# Patient Record
Sex: Female | Born: 1980 | Race: Black or African American | Hispanic: No | Marital: Married | Smoking: Never smoker
Health system: Southern US, Community
[De-identification: ages and names within clinical notes are randomized; demographics above are authoritative.]

## PROBLEM LIST (undated history)

## (undated) DIAGNOSIS — D649 Anemia, unspecified: Secondary | ICD-10-CM

## (undated) DIAGNOSIS — J439 Emphysema, unspecified: Secondary | ICD-10-CM

## (undated) DIAGNOSIS — I10 Essential (primary) hypertension: Secondary | ICD-10-CM

## (undated) DIAGNOSIS — G43909 Migraine, unspecified, not intractable, without status migrainosus: Secondary | ICD-10-CM

## (undated) DIAGNOSIS — D259 Leiomyoma of uterus, unspecified: Secondary | ICD-10-CM

## (undated) DIAGNOSIS — N390 Urinary tract infection, site not specified: Secondary | ICD-10-CM

## (undated) HISTORY — DX: Urinary tract infection, site not specified: N39.0

## (undated) HISTORY — DX: Emphysema, unspecified: J43.9

---
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
## 1999-09-23 ENCOUNTER — Encounter: Payer: Self-pay | Admitting: Emergency Medicine

## 1999-09-23 ENCOUNTER — Emergency Department (HOSPITAL_COMMUNITY): Admission: EM | Admit: 1999-09-23 | Discharge: 1999-09-23 | Payer: Self-pay | Admitting: Emergency Medicine

## 1999-12-22 ENCOUNTER — Inpatient Hospital Stay (HOSPITAL_COMMUNITY): Admission: AD | Admit: 1999-12-22 | Discharge: 1999-12-22 | Payer: Self-pay | Admitting: *Deleted

## 2000-01-03 ENCOUNTER — Ambulatory Visit (HOSPITAL_COMMUNITY): Admission: RE | Admit: 2000-01-03 | Discharge: 2000-01-03 | Payer: Self-pay | Admitting: *Deleted

## 2000-02-19 ENCOUNTER — Encounter: Payer: Self-pay | Admitting: *Deleted

## 2000-02-20 ENCOUNTER — Encounter: Payer: Self-pay | Admitting: *Deleted

## 2000-02-20 ENCOUNTER — Observation Stay (HOSPITAL_COMMUNITY): Admission: AD | Admit: 2000-02-20 | Discharge: 2000-02-21 | Payer: Self-pay | Admitting: *Deleted

## 2000-04-04 ENCOUNTER — Inpatient Hospital Stay (HOSPITAL_COMMUNITY): Admission: AD | Admit: 2000-04-04 | Discharge: 2000-04-04 | Payer: Self-pay | Admitting: Obstetrics & Gynecology

## 2000-04-10 ENCOUNTER — Inpatient Hospital Stay (HOSPITAL_COMMUNITY): Admission: AD | Admit: 2000-04-10 | Discharge: 2000-04-10 | Payer: Self-pay | Admitting: *Deleted

## 2000-04-11 ENCOUNTER — Inpatient Hospital Stay (HOSPITAL_COMMUNITY): Admission: AD | Admit: 2000-04-11 | Discharge: 2000-04-11 | Payer: Self-pay | Admitting: Obstetrics & Gynecology

## 2000-04-11 ENCOUNTER — Inpatient Hospital Stay (HOSPITAL_COMMUNITY): Admission: AD | Admit: 2000-04-11 | Discharge: 2000-04-11 | Payer: Self-pay | Admitting: *Deleted

## 2000-04-17 ENCOUNTER — Inpatient Hospital Stay (HOSPITAL_COMMUNITY): Admission: AD | Admit: 2000-04-17 | Discharge: 2000-04-17 | Payer: Self-pay | Admitting: Obstetrics & Gynecology

## 2000-04-20 ENCOUNTER — Encounter: Payer: Self-pay | Admitting: *Deleted

## 2000-04-20 ENCOUNTER — Observation Stay (HOSPITAL_COMMUNITY): Admission: AD | Admit: 2000-04-20 | Discharge: 2000-04-21 | Payer: Self-pay | Admitting: *Deleted

## 2000-04-24 ENCOUNTER — Inpatient Hospital Stay (HOSPITAL_COMMUNITY): Admission: AD | Admit: 2000-04-24 | Discharge: 2000-04-24 | Payer: Self-pay | Admitting: Obstetrics & Gynecology

## 2000-04-24 ENCOUNTER — Encounter: Payer: Self-pay | Admitting: Obstetrics & Gynecology

## 2000-05-03 ENCOUNTER — Inpatient Hospital Stay (HOSPITAL_COMMUNITY): Admission: AD | Admit: 2000-05-03 | Discharge: 2000-05-03 | Payer: Self-pay | Admitting: *Deleted

## 2000-05-26 ENCOUNTER — Inpatient Hospital Stay (HOSPITAL_COMMUNITY): Admission: AD | Admit: 2000-05-26 | Discharge: 2000-05-26 | Payer: Self-pay | Admitting: Obstetrics & Gynecology

## 2000-05-27 ENCOUNTER — Inpatient Hospital Stay (HOSPITAL_COMMUNITY): Admission: AD | Admit: 2000-05-27 | Discharge: 2000-05-29 | Payer: Self-pay | Admitting: *Deleted

## 2000-11-04 ENCOUNTER — Emergency Department (HOSPITAL_COMMUNITY): Admission: EM | Admit: 2000-11-04 | Discharge: 2000-11-04 | Payer: Self-pay | Admitting: Emergency Medicine

## 2000-12-12 ENCOUNTER — Emergency Department (HOSPITAL_COMMUNITY): Admission: EM | Admit: 2000-12-12 | Discharge: 2000-12-12 | Payer: Self-pay | Admitting: Emergency Medicine

## 2000-12-12 ENCOUNTER — Encounter: Payer: Self-pay | Admitting: Emergency Medicine

## 2001-01-14 ENCOUNTER — Encounter: Payer: Self-pay | Admitting: Obstetrics

## 2001-01-14 ENCOUNTER — Inpatient Hospital Stay (HOSPITAL_COMMUNITY): Admission: AD | Admit: 2001-01-14 | Discharge: 2001-01-14 | Payer: Self-pay | Admitting: *Deleted

## 2001-01-28 ENCOUNTER — Encounter: Admission: RE | Admit: 2001-01-28 | Discharge: 2001-01-28 | Payer: Self-pay | Admitting: Family Medicine

## 2001-01-29 ENCOUNTER — Inpatient Hospital Stay (HOSPITAL_COMMUNITY): Admission: AD | Admit: 2001-01-29 | Discharge: 2001-01-29 | Payer: Self-pay | Admitting: Obstetrics & Gynecology

## 2001-02-12 ENCOUNTER — Other Ambulatory Visit: Admission: RE | Admit: 2001-02-12 | Discharge: 2001-02-12 | Payer: Self-pay | Admitting: Obstetrics and Gynecology

## 2001-03-16 ENCOUNTER — Inpatient Hospital Stay (HOSPITAL_COMMUNITY): Admission: AD | Admit: 2001-03-16 | Discharge: 2001-03-16 | Payer: Self-pay | Admitting: *Deleted

## 2001-05-14 HISTORY — PX: TUBAL LIGATION: SHX77

## 2001-05-22 ENCOUNTER — Inpatient Hospital Stay (HOSPITAL_COMMUNITY): Admission: AD | Admit: 2001-05-22 | Discharge: 2001-05-25 | Payer: Self-pay | Admitting: *Deleted

## 2001-05-22 ENCOUNTER — Encounter (INDEPENDENT_AMBULATORY_CARE_PROVIDER_SITE_OTHER): Payer: Self-pay | Admitting: Specialist

## 2001-11-19 ENCOUNTER — Emergency Department (HOSPITAL_COMMUNITY): Admission: EM | Admit: 2001-11-19 | Discharge: 2001-11-19 | Payer: Self-pay

## 2002-06-03 ENCOUNTER — Emergency Department (HOSPITAL_COMMUNITY): Admission: EM | Admit: 2002-06-03 | Discharge: 2002-06-03 | Payer: Self-pay | Admitting: Emergency Medicine

## 2003-03-10 ENCOUNTER — Emergency Department (HOSPITAL_COMMUNITY): Admission: EM | Admit: 2003-03-10 | Discharge: 2003-03-10 | Payer: Self-pay | Admitting: *Deleted

## 2003-03-10 ENCOUNTER — Emergency Department (HOSPITAL_COMMUNITY): Admission: EM | Admit: 2003-03-10 | Discharge: 2003-03-10 | Payer: Self-pay | Admitting: Emergency Medicine

## 2003-09-21 ENCOUNTER — Emergency Department (HOSPITAL_COMMUNITY): Admission: EM | Admit: 2003-09-21 | Discharge: 2003-09-21 | Payer: Self-pay | Admitting: Emergency Medicine

## 2003-12-23 ENCOUNTER — Emergency Department (HOSPITAL_COMMUNITY): Admission: EM | Admit: 2003-12-23 | Discharge: 2003-12-23 | Payer: Self-pay | Admitting: Family Medicine

## 2004-01-11 ENCOUNTER — Emergency Department (HOSPITAL_COMMUNITY): Admission: EM | Admit: 2004-01-11 | Discharge: 2004-01-11 | Payer: Self-pay | Admitting: Family Medicine

## 2004-04-09 ENCOUNTER — Emergency Department (HOSPITAL_COMMUNITY): Admission: EM | Admit: 2004-04-09 | Discharge: 2004-04-09 | Payer: Self-pay | Admitting: Family Medicine

## 2004-07-31 ENCOUNTER — Inpatient Hospital Stay (HOSPITAL_COMMUNITY): Admission: AD | Admit: 2004-07-31 | Discharge: 2004-07-31 | Payer: Self-pay | Admitting: *Deleted

## 2005-02-06 ENCOUNTER — Emergency Department (HOSPITAL_COMMUNITY): Admission: EM | Admit: 2005-02-06 | Discharge: 2005-02-06 | Payer: Self-pay | Admitting: Family Medicine

## 2005-03-22 ENCOUNTER — Encounter: Admission: RE | Admit: 2005-03-22 | Discharge: 2005-03-22 | Payer: Self-pay | Admitting: Family Medicine

## 2005-07-25 ENCOUNTER — Emergency Department (HOSPITAL_COMMUNITY): Admission: EM | Admit: 2005-07-25 | Discharge: 2005-07-25 | Payer: Self-pay | Admitting: Emergency Medicine

## 2005-09-03 ENCOUNTER — Inpatient Hospital Stay (HOSPITAL_COMMUNITY): Admission: AD | Admit: 2005-09-03 | Discharge: 2005-09-04 | Payer: Self-pay | Admitting: Obstetrics & Gynecology

## 2005-12-13 ENCOUNTER — Emergency Department (HOSPITAL_COMMUNITY): Admission: EM | Admit: 2005-12-13 | Discharge: 2005-12-13 | Payer: Self-pay | Admitting: Family Medicine

## 2010-04-09 ENCOUNTER — Emergency Department (HOSPITAL_COMMUNITY): Admission: EM | Admit: 2010-04-09 | Discharge: 2010-04-09 | Payer: Self-pay | Admitting: Emergency Medicine

## 2010-09-20 LAB — DIFFERENTIAL
Eosinophils Relative: 2 % (ref 0–5)
Lymphs Abs: 1.6 10*3/uL (ref 0.7–4.0)
Monocytes Absolute: 0.7 10*3/uL (ref 0.1–1.0)
Monocytes Relative: 11 % (ref 3–12)
Neutro Abs: 4 10*3/uL (ref 1.7–7.7)
Neutrophils Relative %: 62 % (ref 43–77)

## 2010-09-20 LAB — COMPREHENSIVE METABOLIC PANEL
ALT: 15 U/L (ref 0–35)
AST: 22 U/L (ref 0–37)
BUN: 6 mg/dL (ref 6–23)
CO2: 31 mEq/L (ref 19–32)
Chloride: 100 mEq/L (ref 96–112)
Creatinine, Ser: 0.76 mg/dL (ref 0.4–1.2)
Potassium: 3.8 mEq/L (ref 3.5–5.1)
Sodium: 140 mEq/L (ref 135–145)

## 2010-09-20 LAB — URINALYSIS, ROUTINE W REFLEX MICROSCOPIC
Hgb urine dipstick: NEGATIVE
pH: 7.5 (ref 5.0–8.0)

## 2010-09-20 LAB — WET PREP, GENITAL
Trich, Wet Prep: NONE SEEN
Yeast Wet Prep HPF POC: NONE SEEN

## 2010-09-20 LAB — CBC
RBC: 3.83 MIL/uL — ABNORMAL LOW (ref 3.87–5.11)
WBC: 6.5 10*3/uL (ref 4.0–10.5)

## 2010-09-20 LAB — LIPASE, BLOOD: Lipase: 23 U/L (ref 11–59)

## 2010-11-23 NOTE — Discharge Summary (Signed)
[facility]  Patient:    ANABELL, SWINT                     MRN: [id number] Adm. Date:  [id number] Disc. Date: [id number] Attending:  Michaelle Copas Dictator:   Pricilla Holm, M.D.                           Discharge Summary  SERVICE:                      [facility] Teaching Service.  CONSULTS:                     None.  PROCEDURES:                   None.  HOSPITAL COURSE:              Ms. Eduard Clos is a 30 year old G2, P1-0-0-1 at 26-3/7 weeks by 19-week ultrasound.  The patient presented with a two-day history of headaches, unilateral headaches specifically on the left side.  The patient also notes having spots before her eyes.  She says that occasionally she would have the spots and then the headache would follow but she was not quite sure of this chronology.  She states she was doing nothing in particular when the spots and the headaches presented themselves.  She did take some Tylenol with some relief.  She denies any abdominal pain.  She also denied any swelling in her hands or her feet.  PAST OB HISTORY:              This is significant for a term normal spontaneous vaginal delivery requiring magnesium due to onset of preeclampsia during delivery.  She also required magnesium postpartum.  PAST MEDICAL HISTORY:         The patient has bronchitis as her only past medical history.  PAST SURGICAL HISTORY:        She has had nasal surgery in the past.  HOSPITAL COURSE:              The patient was admitted to the teaching service to be followed overnight.  We ran several labs as well as did a 24-hour urine on her.  She was given Stadol with significant relief of headaches.  On the morning of February 21, 2000, she was still without headache and/or symptoms and it was deemed appropriate to send her home.  Headaches were most likely secondary to migraine than preeclampsia.  LABS:                         The patients urine was completely  normal with no protein noted.  Complete metabolic panel revealed sodium 137, potassium 4.1, chloride 105, carbon dioxide 24, BUN 7, creatinine 0.5, and glucose of 79.  The patient had an SGOT of 19, SGPT of 11, alkaline phosphatase 47, lactic dehydrogenase 177, uric acid 3.5.  Normal CBC with platelet count of 258.  A 24-hour urine is currently pending.  DISCHARGE CONDITION:          Good.  DISPOSITION:                  The patient will be discharged to home with her mother.  DISCHARGE MEDICATIONS:        The patient will be given a prescription for Tylox one  tablet p.o. q.6h. p.r.n. headaches.  DISCHARGE INSTRUCTIONS:       The patient can resume normal activity and diet. She was told to return to [facility] if she continues to have headaches that the Tylox is not helping, if she notices increased swelling in her hands, feet or face, or if she has abdominal pain; as well as other signs or symptoms that she may in fact have preeclampsia.  FOLLOW-UP:                    The patient will follow up this week with her routine OB appointment as previously scheduled.  The patient voiced agreement and understanding of the plan and had no further questions. DD:  02/21/00 TD:  02/22/00 Job: [id number]

## 2010-11-23 NOTE — Discharge Summary (Signed)
[facility]  Patient:    ADALEI, NOVELL Visit Number: [id number] MRN: [id number]          Service Type: OBS Location:910A 9106 01 Attending Physician:  Dierdre Forth [place] Dictatedby:   Saverio Danker, C.N.M. Admit Date:  05/22/2001 Disc. Date: 05/25/01                             Discharge Summary  ADMISSION DIAGNOSES:          1. Intrauterine pregnancy at term.                               2. Early labor.                             3. Negative group B Streptococcus.                    4. Multiparity.                               5. Desires bilateral tubal ligation.  DISCHARGE DIAGNOSES:          1. Intrauterine pregnancy at term.                               2. Early labor.                               3. Negative group B Streptococcus.                      4. Multiparity.                               5. Desires bilateral tubal ligation.                               6. Breast andbottle feeding.  PROCEDURES THIS ADMISSION:    1. Normal spontaneous vaginal delivery of                                  a viable female infant, names Tania, who                                  weighted 5 pounds, 14ounces and had Apgars                                  of 9 and 9 on May 23, 2001, attended                                  by Wynelle Bourgeois, C.N.M.                               2.  Bilateral tubal ligation for sterilization                                  on May 24, 2001 by Dr. Dierdre Forth.  HOSPITAL COURSE:              Ms. Eduard Clos is a 30 year old, single, black female, Gravid 3, Para 2-0-0-2 at 39-5/7 weeks admitted in early labor who progressed to normal spontaneous vaginal delivery without complications.  She delivered a viable female infant, named Ena Dawley, who weighted 5 pounds and 14 ounces and had Apgars of 9 and 9 attended [employer], C.N.M.  Post partum she desired a bilateral tubal ligation for  sterilization and underwent the same without complication attendedby Dr. Dierdre Forth on May 24, 2001.  Postoperatively, she has done well.  She is ambulating, voiding and eating without difficulty.  Hervital signs are stable and she is afebrile. She is deemed ready for discharge today.  [facility]  DISCHARGE MEDICATIONS:        1. Motrin 600 mg p.o.q.6h. p.r.n. pain.                               2. Tylox 1 to 2 p.o. q.4-6h. p.r.n. pain.                               3. Prenatal vitamins daily.  DISCHARGE LABORATORY DATA:    Hemoglobin is 10.6, WBC count is 10.8.  Her platelets are 211.  DISCHARGE FOLLOW UP:          Will be at [facility] in six weeks                               or p.r.n. Dictated by:   Vance Gather Duplantis, C.N.M. AttendingPhysicianShaune Spittle DD:  05/25/01 TD:  05/25/01 Job: [id number]

## 2010-11-23 NOTE — Op Note (Signed)
[facility]  Patient:    Donna Kelley, Donna Kelley Visit Number: [id number] MRN: [id number]          Service Type: OBS Location: 910A 9106 01 Attending Physician:  Shaune Spittle Dictated by:   Maris Berger. Pennie Rushing, M.D. Proc. Date: 05/24/01 Admit Date:  05/22/2001                             Operative Report  PREOPERATIVE DIAGNOSIS:       Desire for surgical sterilization.  POSTOPERATIVE DIAGNOSIS:      Desire for surgical sterilization.  OPERATION:                    Postpartum tubal sterilization.  SURGEON:                      Vanessa P. Pennie Rushing, M.D.  ANESTHESIA:                   General orotracheal.  ESTIMATED BLOOD LOSS:         Les than 10 cc.  COMPLICATIONS:                None.  FINDINGS:                     The tubes appeared normal for the postpartum state.  DESCRIPTION OF PROCEDURE:     A discussion had been held with the patient concerning the indications for her procedure, being desire for surgical sterilization and no further children.  A discussion was also held with her concerning the risks involved which include but are not limited to anesthesia, bleeding, infection, damage to adjacent organs and subsequent pregnancy resulting from failure of tubal sterilization.  The patient seemed to understand and wished to proceed.  He was brought to the operating room after appropriate identification and placed on the operating table.  After the attainment of adequate general anesthesia with the patient in the supine position, the abdomen was prepped with multiple layers of Betadine and draped as a sterile field.  Subumbilical injection of 10 cc of 0.25% Marcaine was undertaken.  A subumbilical incision was made and the abdomen opened in layers to enter the peritoneal cavity.  The right fallopian tube was then identified, followed to it fimbriated end, then grasped at the isthmic portion and elevated.  A suture of 2-0 chromic was  placed through the mesosalpinx was tied fore and aft on that knuckle of tube and a second ligature placed proximal to that.  The intervening knuckle of tube was excised and the cut ends cauterized.  Hemostasis was noted to be adequate.  The tubal remains were returned to the peritoneal cavity.  A similar procedure was carried out on the left side.  The abdominal peritoneum was closed in a pursestring fashion with 0 Vicryl.  The fascia was closed in a running fashion with 0 Vicryl.  A subcutaneous suture of 0 Vicryl was placed.  Subcuticular sutures of 3-0 Vicryl were sued to close the skin incision and a sterile dressing applied. The patient was awakened from general anesthesia and taken to the recovery room in satisfactory condition, having tolerated the procedure well, with sponge and instrument counts correct. Dictated by:   Maris Berger. Pennie Rushing, M.D. Attending Physician:  Shaune Spittle DD:  05/24/01 TD:  05/25/01 Job: [id number]
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
## 2011-02-21 ENCOUNTER — Emergency Department (HOSPITAL_COMMUNITY): Payer: Self-pay

## 2011-02-21 ENCOUNTER — Emergency Department (HOSPITAL_COMMUNITY)
Admission: EM | Admit: 2011-02-21 | Discharge: 2011-02-21 | Disposition: A | Discharge status: Home / Self Care | Payer: Self-pay | Attending: Emergency Medicine | Admitting: Emergency Medicine

## 2011-02-21 DIAGNOSIS — X500XXA Overexertion from strenuous movement or load, initial encounter: Secondary | ICD-10-CM | POA: Insufficient documentation

## 2011-02-21 DIAGNOSIS — M25579 Pain in unspecified ankle and joints of unspecified foot: Secondary | ICD-10-CM | POA: Insufficient documentation

## 2011-02-21 DIAGNOSIS — Y92009 Unspecified place in unspecified non-institutional (private) residence as the place of occurrence of the external cause: Secondary | ICD-10-CM | POA: Insufficient documentation

## 2011-02-21 DIAGNOSIS — S93409A Sprain of unspecified ligament of unspecified ankle, initial encounter: Secondary | ICD-10-CM | POA: Insufficient documentation

## 2011-05-07 ENCOUNTER — Emergency Department (HOSPITAL_COMMUNITY)
Admission: EM | Admit: 2011-05-07 | Discharge: 2011-05-07 | Disposition: A | Discharge status: Home / Self Care | Payer: Self-pay | Attending: Emergency Medicine | Admitting: Emergency Medicine

## 2011-05-07 DIAGNOSIS — R110 Nausea: Secondary | ICD-10-CM | POA: Insufficient documentation

## 2011-05-07 DIAGNOSIS — H53149 Visual discomfort, unspecified: Secondary | ICD-10-CM | POA: Insufficient documentation

## 2011-05-07 DIAGNOSIS — G43909 Migraine, unspecified, not intractable, without status migrainosus: Secondary | ICD-10-CM | POA: Insufficient documentation

## 2011-05-07 DIAGNOSIS — R05 Cough: Secondary | ICD-10-CM | POA: Insufficient documentation

## 2011-05-07 DIAGNOSIS — R059 Cough, unspecified: Secondary | ICD-10-CM | POA: Insufficient documentation

## 2011-05-26 ENCOUNTER — Emergency Department (HOSPITAL_COMMUNITY): Payer: Self-pay

## 2011-05-26 ENCOUNTER — Emergency Department (HOSPITAL_COMMUNITY)
Admission: EM | Admit: 2011-05-26 | Discharge: 2011-05-26 | Disposition: A | Discharge status: Home / Self Care | Payer: Self-pay | Attending: Emergency Medicine | Admitting: Emergency Medicine

## 2011-05-26 DIAGNOSIS — R109 Unspecified abdominal pain: Secondary | ICD-10-CM | POA: Insufficient documentation

## 2011-05-26 HISTORY — DX: Migraine, unspecified, not intractable, without status migrainosus: G43.909

## 2011-05-26 LAB — URINALYSIS, ROUTINE W REFLEX MICROSCOPIC
Ketones, ur: NEGATIVE mg/dL
Specific Gravity, Urine: 1.007 (ref 1.005–1.030)

## 2011-05-26 LAB — DIFFERENTIAL
Basophils Absolute: 0 10*3/uL (ref 0.0–0.1)
Basophils Relative: 0 % (ref 0–1)
Eosinophils Absolute: 0.2 10*3/uL (ref 0.0–0.7)
Eosinophils Relative: 4 % (ref 0–5)
Lymphs Abs: 1.5 10*3/uL (ref 0.7–4.0)
Monocytes Absolute: 0.4 10*3/uL (ref 0.1–1.0)
Monocytes Relative: 8 % (ref 3–12)

## 2011-05-26 LAB — COMPREHENSIVE METABOLIC PANEL
ALT: 11 U/L (ref 0–35)
AST: 17 U/L (ref 0–37)
Albumin: 3.5 g/dL (ref 3.5–5.2)
Chloride: 103 mEq/L (ref 96–112)
Creatinine, Ser: 0.68 mg/dL (ref 0.50–1.10)
GFR calc Af Amer: >90 mL/min (ref >90)
GFR calc non Af Amer: >90 mL/min (ref >90)
Potassium: 3.7 mEq/L (ref 3.5–5.1)
Total Bilirubin: 0.2 mg/dL — ABNORMAL LOW (ref 0.3–1.2)

## 2011-05-26 LAB — CBC
HCT: 30.4 % — ABNORMAL LOW (ref 36.0–46.0)
Hemoglobin: 9.5 g/dL — ABNORMAL LOW (ref 12.0–15.0)
MCV: 88.6 fL (ref 78.0–100.0)
RDW: 14.1 % (ref 11.5–15.5)
WBC: 5.4 10*3/uL (ref 4.0–10.5)

## 2011-05-26 LAB — URINE MICROSCOPIC-ADD ON

## 2011-05-26 MED ORDER — MORPHINE SULFATE 4 MG/ML IJ SOLN
6.0000 mg | Freq: Once | INTRAMUSCULAR | Status: AC
  Administered 2011-05-26: 6 mg via INTRAVENOUS
  Filled 2011-05-26: qty 2

## 2011-05-26 MED ORDER — HYDROCODONE-ACETAMINOPHEN 5-325 MG PO TABS: 1.0000 | ORAL_TABLET | ORAL | Status: AC | PRN

## 2011-05-26 MED ORDER — OMEPRAZOLE 20 MG PO CPDR: 20.0000 mg | DELAYED_RELEASE_CAPSULE | Freq: Every day | ORAL | Status: DC

## 2011-05-26 NOTE — ED Provider Notes (Signed)
History     CSN: [id number] Arrival date & time: 05/26/2011  3:12 AM   First MD Initiated Contact with Patient 05/26/11 0402      Chief Complaint  Patient presents with  . Abdominal Pain    (Consider location/radiation/quality/duration/timing/severity/associated sxs/prior treatment) HPI Comments: Patient presents with abdominal pain that began tonight.  It is in her upper abdomen.  It does radiate to her back bilaterally.  She denies any nausea or vomiting or diarrhea.  She has no fevers.  No dysuria or hematuria.  She's currently on her menses.  She's also had a tubal ligation and so does not believe that she is pregnant.  She is otherwise had no abdominal surgeries.  She's not had symptoms like this before.  She did not try taking any pain medicine at home.  There is no specific inciting or relieving factors.  Patient is a 30 y.o. female presenting with abdominal pain. The history is provided by the patient. No language interpreter was used.  Abdominal Pain The primary symptoms of the illness include abdominal pain. The primary symptoms of the illness do not include fever, shortness of breath, nausea, vomiting, diarrhea, dysuria or vaginal discharge.  Symptoms associated with the illness do not include chills or back pain.    Past Medical History  Diagnosis Date  . Migraines     Past Surgical History  Procedure Date  . Tubal ligation     No family history on file.  History  Substance Use Topics  . Smoking status: Never Smoker   . Smokeless tobacco: Not on file  . Alcohol Use:      stated has few drinks every sev. mopnths    OB History    Grav Para Term Preterm Abortions TAB SAB Ect Mult Living                  Review of Systems  Constitutional: Negative.  Negative for fever and chills.  HENT: Negative.   Eyes: Negative.  Negative for discharge and redness.  Respiratory: Negative.  Negative for cough and shortness of breath.   Cardiovascular: Negative.  Negative  for chest pain.  Gastrointestinal: Positive for abdominal pain. Negative for nausea, vomiting and diarrhea.  Genitourinary: Negative.  Negative for dysuria and vaginal discharge.  Musculoskeletal: Negative.  Negative for back pain.  Skin: Negative.  Negative for color change and rash.  Neurological: Negative.  Negative for syncope and headaches.  Hematological: Negative.  Negative for adenopathy.  Psychiatric/Behavioral: Negative.  Negative for confusion.  All other systems reviewed and are negative.    Allergies  Shellfish allergy and Erythromycin  Home Medications   Current Outpatient Rx  Name Route Sig Dispense Refill  . BIOTIN PO Oral Take by mouth.      . IBUPROFEN 200 MG PO TABS Oral Take 400 mg by mouth every 6 (six) hours as needed. headache     . THERA M PLUS PO TABS Oral Take 1 tablet by mouth daily.        BP 133/85  Pulse 79  Temp(Src) 98 F (36.7 C) (Oral)  Resp 20  Ht 5\' 5"  (1.651 m)  Wt 290 lb (131.543 kg)  BMI 48.26 kg/m2  SpO2 100%  LMP 05/26/2011  Physical Exam  Constitutional: She is oriented to person, place, and time. She appears well-developed and well-nourished.  Non-toxic appearance. She does not have a sickly appearance.  HENT:  Head: Normocephalic and atraumatic.  Eyes: Conjunctivae, EOM and lids are normal. Pupils  are equal, round, and reactive to light. No scleral icterus.  Neck: Trachea normal and normal range of motion. Neck supple.  Cardiovascular: Normal rate, regular rhythm and normal heart sounds.   Pulmonary/Chest: Effort normal and breath sounds normal.  Abdominal: Soft. Normal appearance. She exhibits no distension. There is tenderness in the right upper quadrant, epigastric area and left upper quadrant. There is no rebound, no guarding and no CVA tenderness.  Musculoskeletal: Normal range of motion.  Neurological: She is alert and oriented to person, place, and time. She has normal strength.  Skin: Skin is warm, dry and intact. No  rash noted.  Psychiatric: She has a normal mood and affect. Her behavior is normal. Judgment and thought content normal.    ED Course  Procedures (including critical care time)  Labs Reviewed  URINALYSIS, ROUTINE W REFLEX MICROSCOPIC - Abnormal; Notable for the following:    Hgb urine dipstick LARGE (*)    Leukocytes, UA TRACE (*)    All other components within normal limits  CBC - Abnormal; Notable for the following:    RBC 3.43 (*)    Hemoglobin 9.5 (*)    HCT 30.4 (*)    All other components within normal limits  COMPREHENSIVE METABOLIC PANEL - Abnormal; Notable for the following:    Glucose, Bld 100 (*)    Total Bilirubin 0.2 (*)    All other components within normal limits  URINE MICROSCOPIC-ADD ON - Abnormal; Notable for the following:    Bacteria, UA MANY (*)    All other components within normal limits  DIFFERENTIAL  LIPASE, BLOOD  POCT PREGNANCY, URINE  POCT PREGNANCY, URINE   US Abdomen Complete  05/26/2011  *RADIOLOGY REPORT*  Clinical Data:  Right upper quadrant and epigastric pain.  COMPLETE ABDOMINAL ULTRASOUND  Comparison:  04/09/2010  Findings:  Gallbladder:  No gallstones, gallbladder wall thickening, or pericholecystic fluid.  Common bile duct:  Normal where seen, measuring 2.4 mm proximally. The distal duct is obscured.  Liver:  Mildly heterogeneous echogenicity.  No focal lesion.  IVC:  The intrahepatic segment is obscured.  Pancreas:  Not visualized secondary to bowel gas artifact.  Spleen:  Normal sonographic appearance.  Right Kidney:  Normal size and echogenicity.  No hydronephrosis. Measures 10.2 cm.  Left Kidney:  Normal size and echogenicity.  No hydronephrosis. Measures 11.6 cm.  Abdominal aorta:  Inadequately visualized due to bowel gas artifact.  IMPRESSION: Suboptimal examination due to bowel gas artifact.  No definite gallstones or sonographic evidence for cholecystitis.  Pancreas not visualized.  Original Report Authenticated By: Waneta Martins,  M.D.     No diagnosis found.    MDM  Patient presented with upper abdominal pain that started just tonight.  At this point in time I do not have a definitive etiology for her symptoms.  She does not have any elevations in her LFTs or lipase.  She has a normal ultrasound does not demonstrate any gallstones to suggest that this is symptomatic cholelithiasis.  Patient's pain is improved here with one dose of morphine.  She has no fevers or elevated white count indicate infection.  She otherwise has normal vital signs.  Patient has no other associated nausea, vomiting or diarrhea to suggest a gastroenteritis or other acute intra-abdominal pathology.  Patient may have gastritis or peptic ulcer disease and I counseled her on this.  She understands to return for worsening pain, fevers, nausea vomiting or other concerns.        Nat Christen,  MD 05/26/11 [phone]

## 2011-05-26 NOTE — ED Notes (Signed)
Awoke with abd pain along mid section that radiates to back.

## 2011-05-26 NOTE — ED Notes (Signed)
Patient transported to Ultrasound 

## 2012-01-27 ENCOUNTER — Emergency Department (HOSPITAL_COMMUNITY)
Admission: EM | Admit: 2012-01-27 | Discharge: 2012-01-27 | Disposition: A | Discharge status: Home / Self Care | Payer: Self-pay | Attending: Emergency Medicine | Admitting: Emergency Medicine

## 2012-01-27 ENCOUNTER — Emergency Department (HOSPITAL_COMMUNITY): Payer: Self-pay

## 2012-01-27 ENCOUNTER — Encounter (HOSPITAL_COMMUNITY): Payer: Self-pay | Admitting: *Deleted

## 2012-01-27 DIAGNOSIS — M549 Dorsalgia, unspecified: Secondary | ICD-10-CM | POA: Insufficient documentation

## 2012-01-27 MED ORDER — CYCLOBENZAPRINE HCL 10 MG PO TABS: 10.0000 mg | ORAL_TABLET | Freq: Two times a day (BID) | ORAL | Status: AC | PRN

## 2012-01-27 MED ORDER — DICLOFENAC SODIUM 75 MG PO TBEC: 75.0000 mg | DELAYED_RELEASE_TABLET | Freq: Two times a day (BID) | ORAL | Status: AC

## 2012-01-27 MED ORDER — OXYCODONE-ACETAMINOPHEN 5-325 MG PO TABS
1.0000 | ORAL_TABLET | Freq: Once | ORAL | Status: AC
  Administered 2012-01-27: 1 via ORAL
  Filled 2012-01-27: qty 1

## 2012-01-27 MED ORDER — KETOROLAC TROMETHAMINE 60 MG/2ML IM SOLN
60.0000 mg | Freq: Once | INTRAMUSCULAR | Status: AC
  Administered 2012-01-27: 60 mg via INTRAMUSCULAR
  Filled 2012-01-27: qty 2

## 2012-01-27 NOTE — ED Provider Notes (Signed)
Medical screening examination/treatment/procedure(s) were performed by non-physician practitioner and as supervising physician I was immediately available for consultation/collaboration. Devoria Albe, MD, Armando Gang   Ward Givens, MD 01/27/12 1059

## 2012-01-27 NOTE — ED Notes (Signed)
Report received from Night RN. First contact with pt, pt denied any needs, rate pain 3/10 post medication. Asking when to be discharge. Will continue to monitor.

## 2012-01-27 NOTE — ED Notes (Signed)
Pt c/o upper back pain since past Friday afternoon. Pt denies injury. Gradually worsening. Pt states pain is worsened based on position and inspiration.

## 2012-01-27 NOTE — ED Provider Notes (Signed)
Medical screening examination/treatment/procedure(s) were performed by non-physician practitioner and as supervising physician I was immediately available for consultation/collaboration.  Godric Lavell, MD 01/27/12 0827 

## 2012-01-27 NOTE — ED Provider Notes (Signed)
7:33 AM Signed out to me by Jaci Carrel PA-C. Pending Xray result. Xray is normal and Pt's pain improved. Will D/c with NSAIDs and muscle relaxants. Pt agrees with plan and is ready for d/c  Thomasene Lot, PA-C 01/27/12 1324

## 2012-01-27 NOTE — ED Notes (Signed)
Patient moved to room 18 Alinda Money , RN to give report to oncoming dayshift nurse.

## 2012-01-27 NOTE — ED Provider Notes (Signed)
History     CSN: [id number]  Arrival date & time 01/27/12  0446   First MD Initiated Contact with Patient 01/27/12 [phone]      Chief Complaint  Patient presents with  . Back Pain    (Consider location/radiation/quality/duration/timing/severity/associated sxs/prior treatment) HPI Comments: Pt is a 31yo female with no PMHx that presents to the ER w cc of upper mid back pain. Onset began Friday and is described as a dull pain that has been gradually worsening. She has pain with certain movements adn on deep inspiration. Severity is 6/10. Pain does not radiate. She denies fevers, night sweats, chills, cough, hemoptysis, claudication, numbness or tingling of extremities, weakness, HA, or difficulty ambulating. 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 Denies trauma, IV drug use, CA hx, or steroid use. No other complaints at this time.    Patient is a 31 y.o. female presenting with back pain. The history is provided by the patient.  Back Pain     Past Medical History  Diagnosis Date  . Migraines     Past Surgical History  Procedure Date  . Tubal ligation     History reviewed. No pertinent family history.  History  Substance Use Topics  . Smoking status: Never Smoker   . Smokeless tobacco: Not on file  . Alcohol Use:      stated has few drinks every sev. mopnths    OB History    Grav Para Term Preterm Abortions TAB SAB Ect Mult Living                  Review of Systems  Musculoskeletal: Positive for back pain.  All other systems reviewed and are negative.    Allergies  Shellfish allergy and Erythromycin  Home Medications   Current Outpatient Rx  Name Route Sig Dispense Refill  . BIOTIN PO Oral Take 1 tablet by mouth daily.     . IBUPROFEN 200 MG PO TABS Oral Take 400 mg by mouth every 6 (six) hours as needed. headache     . THERA M PLUS PO TABS Oral Take 1 tablet by mouth daily.        BP 126/81  Pulse 77  Temp 98.9 F (37.2 C) (Oral)  Resp 20  SpO2 99%  LMP  01/05/2012  Physical Exam  Nursing note and vitals reviewed. Constitutional: She is oriented to person, place, and time. She appears well-developed and well-nourished. No distress.  HENT:  Head: Normocephalic and atraumatic.  Eyes: Conjunctivae and EOM are normal. Pupils are equal, round, and reactive to light. No scleral icterus.  Neck: Normal range of motion and full passive range of motion without pain. Neck supple. No spinous process tenderness and no muscular tenderness present. No rigidity. Normal range of motion present. No Brudzinski's sign noted.  Cardiovascular: Normal rate, regular rhythm and intact distal pulses.  Exam reveals no gallop and no friction rub.   No murmur heard. Pulmonary/Chest: Effort normal and breath sounds normal. No respiratory distress. She has no wheezes. She has no rales. She exhibits no tenderness.  Musculoskeletal:       Lumbar back: She exhibits no spasm and normal pulse.       Back:       Right foot: She exhibits no swelling.       Left foot: She exhibits no swelling.       Bilateral lower extremities nontender without color change, baseline range of motion of extremities with intact distal pulses, capillary refill less than  2 seconds bilaterally.  Pt has increased pain w ROM of lumbar spine. Pain w ambulation, no sign of ataxia.  Neurological: She is alert and oriented to person, place, and time. She has normal strength and normal reflexes. No sensory deficit. Gait (no ataxia, slowed and hunched d/t pain ) abnormal.       Sensation at baseline for light touch in all 4 distal extremities, motor symmetric & bilateral 5/5 (hips: abduction, adduction, flexion; knee: flexion & extension; foot: dorsiflexion, plantar flexion, toes: dorsi flexion) Patellar & ankle reflexes intact.   Skin: Skin is warm and dry. No rash noted. She is not diaphoretic. No erythema. No pallor.  Psychiatric: She has a normal mood and affect.    ED Course  Procedures (including  critical care time)  Labs Reviewed - No data to display No results found.   No diagnosis found.    MDM  Back pain  Pt is seen and examined; Initial history and physical done, pain meds given. imaging pending. Disposition will be pending lab studies and reassessment. Will be signed out to the oncoming provider, Albertine Grates           [place], [place] 01/27/12 [phone]

## 2012-04-06 ENCOUNTER — Encounter (HOSPITAL_COMMUNITY): Payer: Self-pay | Admitting: *Deleted

## 2012-04-06 ENCOUNTER — Emergency Department (HOSPITAL_COMMUNITY)
Admission: EM | Admit: 2012-04-06 | Discharge: 2012-04-06 | Disposition: A | Discharge status: Home / Self Care | Payer: Self-pay | Attending: Emergency Medicine | Admitting: Emergency Medicine

## 2012-04-06 DIAGNOSIS — Z881 Allergy status to other antibiotic agents status: Secondary | ICD-10-CM | POA: Insufficient documentation

## 2012-04-06 DIAGNOSIS — Z91013 Allergy to seafood: Secondary | ICD-10-CM | POA: Insufficient documentation

## 2012-04-06 DIAGNOSIS — R51 Headache: Secondary | ICD-10-CM | POA: Insufficient documentation

## 2012-04-06 DIAGNOSIS — R002 Palpitations: Secondary | ICD-10-CM | POA: Insufficient documentation

## 2012-04-06 LAB — BASIC METABOLIC PANEL
BUN: 9 mg/dL (ref 6–23)
Calcium: 8.9 mg/dL (ref 8.4–10.5)
Creatinine, Ser: 0.7 mg/dL (ref 0.50–1.10)
GFR calc Af Amer: >90 mL/min (ref >90)
GFR calc non Af Amer: >90 mL/min (ref >90)
Glucose, Bld: 98 mg/dL (ref 70–99)

## 2012-04-06 MED ORDER — OXYCODONE-ACETAMINOPHEN 5-325 MG PO TABS
1.0000 | ORAL_TABLET | Freq: Once | ORAL | Status: AC
  Administered 2012-04-06: 1 via ORAL
  Filled 2012-04-06: qty 1

## 2012-04-06 MED ORDER — ACETAMINOPHEN 325 MG PO TABS
650.0000 mg | ORAL_TABLET | Freq: Once | ORAL | Status: AC
  Administered 2012-04-06: 650 mg via ORAL
  Filled 2012-04-06: qty 2

## 2012-04-06 MED ORDER — IBUPROFEN 800 MG PO TABS
800.0000 mg | ORAL_TABLET | Freq: Once | ORAL | Status: AC
  Administered 2012-04-06: 800 mg via ORAL
  Filled 2012-04-06: qty 1

## 2012-04-06 NOTE — ED Provider Notes (Signed)
History     CSN: [id number]  Arrival date & time 04/06/12  7846   First MD Initiated Contact with Patient 04/06/12 [phone]      Chief Complaint  Patient presents with  . Palpitations  . Headache    (Consider location/radiation/quality/duration/timing/severity/associated sxs/prior treatment) Patient is a 31 y.o. female presenting with palpitations and headaches. The history is provided by the patient.  Palpitations  This is a new problem. Associated symptoms include headaches. Pertinent negatives include no numbness, no chest pain, no abdominal pain, no nausea, no vomiting, no back pain, no weakness and no shortness of breath.  Headache  Associated symptoms include palpitations. Pertinent negatives include no shortness of breath, no nausea and no vomiting.  patient presents with palpitations. She states they come and go. She's been having them for a few months. Slightly worse today. No lightheadedness or dizziness. She has had one episode of chest pain was not associated with the palpitations. The lightheadedness dizziness. No cough. No fevers. No abdominal pain.  Patient also been complaining of a headache. It is dull and is on left side of her head. It is worse with palpation. No photophobia. No trauma. Mild nausea without vomiting. No trouble hearing. No trouble seeing. No double vision. This is different than her typical migraine.  Past Medical History  Diagnosis Date  . Migraines     Past Surgical History  Procedure Date  . Tubal ligation     History reviewed. No pertinent family history.  History  Substance Use Topics  . Smoking status: Never Smoker   . Smokeless tobacco: Not on file  . Alcohol Use:      stated has few drinks every sev. mopnths    OB History    Grav Para Term Preterm Abortions TAB SAB Ect Mult Living                  Review of Systems  Constitutional: Negative for activity change and appetite change.  HENT: Negative for neck stiffness.   Eyes:  Negative for pain.  Respiratory: Negative for chest tightness and shortness of breath.   Cardiovascular: Positive for palpitations. Negative for chest pain and leg swelling.  Gastrointestinal: Negative for nausea, vomiting, abdominal pain and diarrhea.  Genitourinary: Negative for flank pain.  Musculoskeletal: Negative for back pain.  Skin: Negative for rash.  Neurological: Positive for headaches. Negative for weakness and numbness.  Psychiatric/Behavioral: Negative for behavioral problems.    Allergies  Shellfish allergy and Erythromycin  Home Medications   Current Outpatient Rx  Name Route Sig Dispense Refill  . BIOTIN PO Oral Take 1 tablet by mouth daily.     Marland Kitchen DICLOFENAC SODIUM 75 MG PO TBEC Oral Take 1 tablet (75 mg total) by mouth 2 (two) times daily. 30 tablet 0  . IBUPROFEN 200 MG PO TABS Oral Take 400 mg by mouth every 6 (six) hours as needed. headache     . THERA M PLUS PO TABS Oral Take 1 tablet by mouth daily.        BP 159/72  Pulse 52  Temp 98 F (36.7 C) (Oral)  Resp 21  SpO2 98%  LMP 04/01/2012  Physical Exam  Nursing note and vitals reviewed. Constitutional: She is oriented to person, place, and time. She appears well-developed and well-nourished.  HENT:  Head: Normocephalic and atraumatic.       No sinus tenderness. TMs normal bilaterally. Extraocular movements intact. No rash. Mild tenderness over left temporal area. No mass.  Eyes:  EOM are normal. Pupils are equal, round, and reactive to light.  Neck: Normal range of motion. Neck supple.  Cardiovascular: Normal rate, regular rhythm and normal heart sounds.   No murmur heard. Pulmonary/Chest: Effort normal and breath sounds normal. No respiratory distress. She has no wheezes. She has no rales.  Abdominal: Soft. Bowel sounds are normal. She exhibits no distension. There is no tenderness. There is no rebound and no guarding.  Musculoskeletal: Normal range of motion.  Neurological: She is alert and  oriented to person, place, and time. No cranial nerve deficit.  Skin: Skin is warm and dry.  Psychiatric: She has a normal mood and affect. Her speech is normal.    ED Course  Procedures (including critical care time)   Labs Reviewed  BASIC METABOLIC PANEL   No results found.   1. Palpitations   2. Headache     Date: 04/06/2012  Rate: *63  Rhythm: normal sinus rhythm  QRS Axis: normal  Intervals: normal  ST/T Wave abnormalities: normal  Conduction Disutrbances:none  Narrative Interpretation:   Old EKG Reviewed: none available    MDM  Patient with headache and palpitations. EKG is reassuring. Benign exam and history. Will discharge home         Juliet Rude. Rubin Payor, MD 04/06/12 [phone]

## 2012-04-06 NOTE — ED Notes (Signed)
Patient is alert and oriented x3.  She is complaining of headache that she rates a 5 of 10. She has an additional complaint of heart palpitations with lightheaded and dizziness. She does report some nausea but denies vomiting.

## 2012-04-06 NOTE — ED Notes (Signed)
MD at bedside. 

## 2013-08-01 ENCOUNTER — Encounter (HOSPITAL_COMMUNITY): Payer: Self-pay | Admitting: Emergency Medicine

## 2013-08-01 ENCOUNTER — Emergency Department (HOSPITAL_COMMUNITY): Payer: Self-pay

## 2013-08-01 ENCOUNTER — Emergency Department (HOSPITAL_COMMUNITY)
Admission: EM | Admit: 2013-08-01 | Discharge: 2013-08-01 | Disposition: A | Discharge status: Home / Self Care | Payer: Self-pay | Attending: Emergency Medicine | Admitting: Emergency Medicine

## 2013-08-01 DIAGNOSIS — R5381 Other malaise: Secondary | ICD-10-CM | POA: Insufficient documentation

## 2013-08-01 DIAGNOSIS — R5383 Other fatigue: Secondary | ICD-10-CM

## 2013-08-01 DIAGNOSIS — R52 Pain, unspecified: Secondary | ICD-10-CM | POA: Insufficient documentation

## 2013-08-01 DIAGNOSIS — Z8669 Personal history of other diseases of the nervous system and sense organs: Secondary | ICD-10-CM | POA: Insufficient documentation

## 2013-08-01 DIAGNOSIS — J3489 Other specified disorders of nose and nasal sinuses: Secondary | ICD-10-CM | POA: Insufficient documentation

## 2013-08-01 DIAGNOSIS — J40 Bronchitis, not specified as acute or chronic: Secondary | ICD-10-CM | POA: Insufficient documentation

## 2013-08-01 MED ORDER — ACETAMINOPHEN 325 MG PO TABS
650.0000 mg | ORAL_TABLET | Freq: Once | ORAL | Status: AC
  Administered 2013-08-01: 650 mg via ORAL

## 2013-08-01 MED ORDER — GUAIFENESIN ER 600 MG PO TB12: 1200.0000 mg | ORAL_TABLET | Freq: Two times a day (BID) | ORAL | Status: DC

## 2013-08-01 MED ORDER — CETIRIZINE-PSEUDOEPHEDRINE ER 5-120 MG PO TB12: 1.0000 | ORAL_TABLET | Freq: Two times a day (BID) | ORAL | Status: DC

## 2013-08-01 MED ORDER — ALBUTEROL SULFATE HFA 108 (90 BASE) MCG/ACT IN AERS
2.0000 | INHALATION_SPRAY | RESPIRATORY_TRACT | Status: DC | PRN
  Administered 2013-08-01: 2 via RESPIRATORY_TRACT
  Filled 2013-08-01: qty 6.7

## 2013-08-01 NOTE — Discharge Instructions (Signed)
Your chest x-ray today did not show any signs of pneumonia or other concerning cause of your cough and congestion symptoms. Please use the albuterol inhaler that you were given by taking 2 puffs every 4 hours as needed for cough and chest tightness. Use medications recommended to help with your symptoms. Followup with a primary care provider for continued evaluation and treatment.    Bronchitis Bronchitis is inflammation of the airways that extend from the windpipe into the lungs (bronchi). The inflammation often causes mucus to develop, which leads to a cough. If the inflammation becomes severe, it may cause shortness of breath. CAUSES  Bronchitis may be caused by:   Viral infections.   Bacteria.   Cigarette smoke.   Allergens, pollutants, and other irritants.  SIGNS AND SYMPTOMS  The most common symptom of bronchitis is a frequent cough that produces mucus. Other symptoms include:  Fever.   Body aches.   Chest congestion.   Chills.   Shortness of breath.   Sore throat.  DIAGNOSIS  Bronchitis is usually diagnosed through a medical history and physical exam. Tests, such as chest X-rays, are sometimes done to rule out other conditions.  TREATMENT  You may need to avoid contact with whatever caused the problem (smoking, for example). Medicines are sometimes needed. These may include:  Antibiotics. These may be prescribed if the condition is caused by bacteria.  Cough suppressants. These may be prescribed for relief of cough symptoms.   Inhaled medicines. These may be prescribed to help open your airways and make it easier for you to breathe.   Steroid medicines. These may be prescribed for those with recurrent (chronic) bronchitis. HOME CARE INSTRUCTIONS  Get plenty of rest.   Drink enough fluids to keep your urine clear or pale yellow (unless you have a medical condition that requires fluid restriction). Increasing fluids may help thin your secretions and  will prevent dehydration.   Only take over-the-counter or prescription medicines as directed by your health care provider.  Only take antibiotics as directed. Make sure you finish them even if you start to feel better.  Avoid secondhand smoke, irritating chemicals, and strong fumes. These will make bronchitis worse. If you are a smoker, quit smoking. Consider using nicotine gum or skin patches to help control withdrawal symptoms. Quitting smoking will help your lungs heal faster.   Put a cool-mist humidifier in your bedroom at night to moisten the air. This may help loosen mucus. Change the water in the humidifier daily. You can also run the hot water in your shower and sit in the bathroom with the door closed for 5 10 minutes.   Follow up with your health care provider as directed.   Wash your hands frequently to avoid catching bronchitis again or spreading an infection to others.  SEEK MEDICAL CARE IF: Your symptoms do not improve after 1 week of treatment.  SEEK IMMEDIATE MEDICAL CARE IF:  Your fever increases.  You have chills.   You have chest pain.   You have worsening shortness of breath.   You have bloody sputum.  You faint.  You have lightheadedness.  You have a severe headache.   You vomit repeatedly. MAKE SURE YOU:   Understand these instructions.  Will watch your condition.  Will get help right away if you are not doing well or get worse. Document Released: 06/24/2005 Document Revised: 04/14/2013 Document Reviewed: 02/16/2013 [facility] Patient Information 2014 [place].

## 2013-08-01 NOTE — ED Notes (Addendum)
Presents with nasal congestion, sore throat, runny nose and cough began last night. +producitve cough with green sputum. Reports chills, generalized body aches and headaches.

## 2013-08-01 NOTE — ED Provider Notes (Signed)
CSN: [id number]     Arrival date & time 08/01/13  0016 History   First MD Initiated Contact with Patient 08/01/13 [phone]     Chief Complaint  Patient presents with  . URI   HPI  History provided by the patient. Patient is a 33 year old female with history of migraines who presents with symptoms of cough, nasal congestion, sore throat and bodyaches. Symptoms first began last night with scratchy sore throat followed by nasal congestion, rhinorrhea and bodyaches. Patient has also felt feverish with hot and cold flashes. Symptoms also sedated with a mild headache. She denies any recent travel. She has been around other people have been sick recently. She denies any associated nausea, vomiting or diarrhea. She is not using medications for treatment. No other aggravating or alleviating factors. No other associated symptoms.    Past Medical History  Diagnosis Date  . Migraines    Past Surgical History  Procedure Laterality Date  . Tubal ligation     History reviewed. No pertinent family history. History  Substance Use Topics  . Smoking status: Never Smoker   . Smokeless tobacco: Not on file  . Alcohol Use:      Comment: stated has few drinks every sev. mopnths   OB History   Grav Para Term Preterm Abortions TAB SAB Ect Mult Living                 Review of Systems  Constitutional: Positive for fever, chills and fatigue.  HENT: Positive for congestion, rhinorrhea and sore throat.   Respiratory: Positive for cough.   Cardiovascular: Negative for chest pain.  Gastrointestinal: Negative for nausea, vomiting and diarrhea.  Musculoskeletal: Positive for myalgias.  Skin: Negative for rash.  All other systems reviewed and are negative.    Allergies  Shellfish allergy and Erythromycin  Home Medications   Current Outpatient Rx  Name  Route  Sig  Dispense  Refill  . Tetrahydroz-Polyvinyl Al-Povid (MURINE TEARS PLUS) 0.05-0.5-0.6 % SOLN   Left Eye   Place 1-3 drops into the left eye  as needed (eye irritation and dryness).          BP 160/107  Pulse 86  Temp(Src) 98.8 F (37.1 C) (Oral)  Resp 16  SpO2 98%  LMP 07/26/2013 Physical Exam  Nursing note and vitals reviewed. Constitutional: She is oriented to person, place, and time. She appears well-developed and well-nourished. No distress.  HENT:  Head: Normocephalic.  Right Ear: Tympanic membrane normal.  Left Ear: Tympanic membrane normal.  Mouth/Throat: Oropharynx is clear and moist.  Neck: Normal range of motion. Neck supple.  No meningeal signs  Cardiovascular: Normal rate and regular rhythm.   Pulmonary/Chest: Effort normal and breath sounds normal. No respiratory distress. She has no wheezes. She has no rales.  Abdominal: Soft. There is no tenderness. There is no rebound and no guarding.  Obese  Musculoskeletal: Normal range of motion.  Neurological: She is alert and oriented to person, place, and time.  Skin: Skin is warm and dry. No rash noted.  Psychiatric: She has a normal mood and affect. Her behavior is normal.    ED Course  Procedures   DIAGNOSTIC STUDIES: Oxygen Saturation is 100% on room air.    COORDINATION OF CARE:  Nursing notes reviewed. Vital signs reviewed. Initial pt interview and examination performed.   2:13 AM-patient seen and evaluated. She does not appear in any acute distress. Does not appear severely ill or toxic. Chest x-ray reviewed. No concerning findings for  pneumonia. Normal respirations and O2 sats. Symptoms consistent with viral process. Discussed treatment plan with pt at bedside, which includes medicines for symptoms. Pt agrees with plan.    Imaging Review Dg Chest 2 View  08/01/2013   CLINICAL DATA:  Shortness of breath, history of bronchitis.  EXAM: CHEST  2 VIEW  COMPARISON:  Chest radiograph May 21, 2006  FINDINGS: Cardiomediastinal silhouette is unremarkable. The lungs are clear without pleural effusions or focal consolidations. Pulmonary vasculature is  unremarkable. Trachea projects midline and there is no pneumothorax. Soft tissue planes and included osseous structures are nonsuspicious.  IMPRESSION: No acute cardiopulmonary process.   Electronically Signed   By: Elon Alas   On: 08/01/2013 01:17     MDM   1. Bronchitis       Martie Lee, PA-C 08/01/13 2240

## 2013-08-02 NOTE — ED Provider Notes (Signed)
Medical screening examination/treatment/procedure(s) were performed by non-physician practitioner and as supervising physician I was immediately available for consultation/collaboration.   Delora Fuel, MD 97/02/63 [phone]

## 2013-11-28 ENCOUNTER — Encounter (HOSPITAL_COMMUNITY): Payer: Self-pay | Admitting: Emergency Medicine

## 2013-11-28 DIAGNOSIS — Z791 Long term (current) use of non-steroidal anti-inflammatories (NSAID): Secondary | ICD-10-CM | POA: Insufficient documentation

## 2013-11-28 DIAGNOSIS — X503XXA Overexertion from repetitive movements, initial encounter: Secondary | ICD-10-CM | POA: Insufficient documentation

## 2013-11-28 DIAGNOSIS — Z79899 Other long term (current) drug therapy: Secondary | ICD-10-CM | POA: Insufficient documentation

## 2013-11-28 DIAGNOSIS — Y939 Activity, unspecified: Secondary | ICD-10-CM | POA: Insufficient documentation

## 2013-11-28 DIAGNOSIS — Y929 Unspecified place or not applicable: Secondary | ICD-10-CM | POA: Insufficient documentation

## 2013-11-28 DIAGNOSIS — S43429A Sprain of unspecified rotator cuff capsule, initial encounter: Secondary | ICD-10-CM | POA: Insufficient documentation

## 2013-11-28 DIAGNOSIS — Z7982 Long term (current) use of aspirin: Secondary | ICD-10-CM | POA: Insufficient documentation

## 2013-11-28 DIAGNOSIS — Z8679 Personal history of other diseases of the circulatory system: Secondary | ICD-10-CM | POA: Insufficient documentation

## 2013-11-28 NOTE — ED Notes (Signed)
C/o L shoulder and L clavicle pain x 1 month.  Denies known injury.  Pain worse with movement.

## 2013-11-29 ENCOUNTER — Emergency Department (HOSPITAL_COMMUNITY): Payer: Self-pay

## 2013-11-29 ENCOUNTER — Emergency Department (HOSPITAL_COMMUNITY)
Admission: EM | Admit: 2013-11-29 | Discharge: 2013-11-29 | Disposition: A | Discharge status: Home / Self Care | Payer: Self-pay | Attending: Emergency Medicine | Admitting: Emergency Medicine

## 2013-11-29 DIAGNOSIS — S46019A Strain of muscle(s) and tendon(s) of the rotator cuff of unspecified shoulder, initial encounter: Secondary | ICD-10-CM

## 2013-11-29 DIAGNOSIS — M25512 Pain in left shoulder: Secondary | ICD-10-CM

## 2013-11-29 MED ORDER — OXYCODONE-ACETAMINOPHEN 5-325 MG PO TABS
2.0000 | ORAL_TABLET | Freq: Once | ORAL | Status: AC
  Administered 2013-11-29: 2 via ORAL
  Filled 2013-11-29: qty 2

## 2013-11-29 MED ORDER — TRAMADOL HCL 50 MG PO TABS: 50.0000 mg | ORAL_TABLET | Freq: Four times a day (QID) | ORAL | Status: DC | PRN

## 2013-11-29 MED ORDER — MELOXICAM 7.5 MG PO TABS: 15.0000 mg | ORAL_TABLET | Freq: Every day | ORAL | Status: DC

## 2013-11-29 NOTE — Discharge Instructions (Signed)
Recommend Mobic as prescribed for symptoms as well as ice to your shoulder 3-4 times per day. You may take Tylenol as prescribed for severe pain/breakthrough pain control. Followup with orthopedics for further evaluation of your symptoms. Refrain from strenuous activity or heavy lifting for one week. Return if symptoms worsen.  Rotator Cuff Injury Rotator cuff injury is any type of injury to the set of muscles and tendons that make up the stabilizing unit of your shoulder. This unit holds the ball of your upper arm bone (humerus) in the socket of your shoulder blade (scapula).  CAUSES Injuries to your rotator cuff most commonly come from sports or activities that cause your arm to be moved repeatedly over your head. Examples of this include throwing, weight lifting, swimming, or racquet sports. Long lasting (chronic) irritation of your rotator cuff can cause soreness and swelling (inflammation), bursitis, and eventual damage to your tendons, such as a tear (rupture). SIGNS AND SYMPTOMS Acute rotator cuff tear:  Sudden tearing sensation followed by severe pain shooting from your upper shoulder down your arm toward your elbow.  Decreased range of motion of your shoulder because of pain and muscle spasm.  Severe pain.  Inability to raise your arm out to the side because of pain and loss of muscle power (large tears). Chronic rotator cuff tear:  Pain that usually is worse at night and may interfere with sleep.  Gradual weakness and decreased shoulder motion as the pain worsens.  Decreased range of motion. Rotator cuff tendinitis:  Deep ache in your shoulder and the outside upper arm over your shoulder.  Pain that comes on gradually and becomes worse when lifting your arm to the side or turning it inward. DIAGNOSIS Rotator cuff injury is diagnosed through a medical history, physical exam, and imaging exam. The medical history helps determine the type of rotator cuff injury. Your health care  provider will look at your injured shoulder, feel the injured area, and ask you to move your shoulder in different positions. X-ray exams typically are done to rule out other causes of shoulder pain, such as fractures. MRI is the exam of choice for the most severe shoulder injuries because the images show muscles and tendons.  TREATMENT  Chronic tear:  Medicine for pain, such as acetaminophen or ibuprofen.  Physical therapy and range-of-motion exercises may be helpful in maintaining shoulder function and strength.  Steroid injections into your shoulder joint.  Surgical repair of the rotator cuff if the injury does not heal with noninvasive treatment. Acute tear:  Anti-inflammatory medicines such as ibuprofen and naproxen to help reduce pain and swelling.  A sling to help support your arm and rest your rotator cuff muscles. Long-term use of a sling is not advised. It may cause significant stiffening of the shoulder joint.  Surgery may be considered within a few weeks, especially in younger, active people, to return the shoulder to full function.  Indications for surgical treatment include the following:  Age younger than 5 years.  Rotator cuff tears that are complete.  Physical therapy, rest, and anti-inflammatory medicines have been used for 6 8 weeks, with no improvement.  Employment or sporting activity that requires constant shoulder use. Tendinitis:  Anti-inflammatory medicines such as ibuprofen and naproxen to help reduce pain and swelling.  A sling to help support your arm and rest your rotator cuff muscles. Long-term use of a sling is not advised. It may cause significant stiffening of the shoulder joint.  Severe tendinitis may require:  Steroid  injections into your shoulder joint.  Physical therapy.  Surgery. HOME CARE INSTRUCTIONS   Apply ice to your injury:  Put ice in a plastic bag.  Place a towel between your skin and the bag.  Leave the ice on for 20  minutes, 2 3 times a day.  If you have a shoulder immobilizer (sling and straps), wear it until told otherwise by your health care provider.  You may want to sleep on several pillows or in a recliner at night to lessen swelling and pain.  Only take over-the-counter or prescription medicines for pain, discomfort, or fever as directed by your health care provider.  Do simple hand squeezing exercises with a soft rubber ball to decrease hand swelling. SEEK MEDICAL CARE IF:   Your shoulder pain increases, or new pain or numbness develops in your arm, hand, or fingers.  Your hand or fingers are colder than your other hand. SEEK IMMEDIATE MEDICAL CARE IF:   Your arm, hand, or fingers are numb or tingling.  Your arm, hand, or fingers are increasingly swollen and painful, or they turn white or blue. MAKE SURE YOU:  Understand these instructions.  Will watch your condition.  Will get help right away if you are not doing well or get worse. Document Released: 06/21/2000 Document Revised: 04/14/2013 Document Reviewed: 02/03/2013 [facility] Patient Information 2014 [place]. RICE: Routine Care for Injuries The routine care of many injuries includes Rest, Ice, Compression, and Elevation (RICE). HOME CARE INSTRUCTIONS  Rest is needed to allow your body to heal. Routine activities can usually be resumed when comfortable. Injured tendons and bones can take up to 6 weeks to heal. Tendons are the cord-like structures that attach muscle to bone.  Ice following an injury helps keep the swelling down and reduces pain.  Put ice in a plastic bag.  Place a towel between your skin and the bag.  Leave the ice on for 15-20 minutes, 03-04 times a day. Do this while awake, for the first 24 to 48 hours. After that, continue as directed by your caregiver.  Compression helps keep swelling down. It also gives support and helps with discomfort. If an elastic bandage has been applied, it should be removed  and reapplied every 3 to 4 hours. It should not be applied tightly, but firmly enough to keep swelling down. Watch fingers or toes for swelling, bluish discoloration, coldness, numbness, or excessive pain. If any of these problems occur, remove the bandage and reapply loosely. Contact your caregiver if these problems continue.  Elevation helps reduce swelling and decreases pain. With extremities, such as the arms, hands, legs, and feet, the injured area should be placed near or above the level of the heart, if possible. SEEK IMMEDIATE MEDICAL CARE IF:  You have persistent pain and swelling.  You develop redness, numbness, or unexpected weakness.  Your symptoms are getting worse rather than improving after several days. These symptoms may indicate that further evaluation or further X-rays are needed. Sometimes, X-rays may not show a small broken bone (fracture) until 1 week or 10 days later. Make a follow-up appointment with your caregiver. Ask when your X-ray results will be ready. Make sure you get your X-ray results. Document Released: 10/06/2000 Document Revised: 09/16/2011 Document Reviewed: 11/23/2010 [facility] Patient Information 2014 [place], [place].

## 2013-11-29 NOTE — ED Provider Notes (Signed)
CSN: [id number]     Arrival date & time 11/28/13  2342 History   First MD Initiated Contact with Patient 11/29/13 0401     Chief Complaint  Patient presents with  . Shoulder Pain    (Consider location/radiation/quality/duration/timing/severity/associated sxs/prior Treatment) HPI Comments: 33 year old female with a history of migraines presents to the emergency department for left shoulder pain. 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 Pain worsens with movement and is mildly relieved with NSAIDs. Patient denies associated fever, pallor, joint swelling, numbness/tingling, and extremity weakness.  Patient is a 33 y.o. female presenting with shoulder pain. The history is provided by the patient. No language interpreter was used.  Shoulder Pain This is a new problem. Episode onset: 1 month ago. The problem occurs constantly. The problem has been gradually worsening. Associated symptoms include arthralgias and myalgias. Pertinent negatives include no fever, joint swelling, neck pain, numbness or weakness. Exacerbated by: movement and lifting. She has tried NSAIDs for the symptoms. The treatment provided mild (temporary) relief.    Past Medical History  Diagnosis Date  . Migraines    Past Surgical History  Procedure Laterality Date  . Tubal ligation     No family history on file. History  Substance Use Topics  . Smoking status: Never Smoker   . Smokeless tobacco: Not on file  . Alcohol Use: Yes     Comment: stated has few drinks every sev. mopnths   OB History   Grav Para Term Preterm Abortions TAB SAB Ect Mult Living                  Review of Systems  Constitutional: Negative for fever.  Musculoskeletal: Positive for arthralgias and myalgias. Negative for joint swelling and neck pain.  Skin: Negative for color change and pallor.  Neurological: Negative for weakness and numbness.   All other systems reviewed and are negative.    Allergies  Shellfish allergy and Erythromycin  Home Medications   Prior to Admission medications   Medication Sig Start Date End Date Taking? Authorizing Provider  albuterol (PROVENTIL HFA;VENTOLIN HFA) 108 (90 BASE) MCG/ACT inhaler Inhale 2 puffs into the lungs every 6 (six) hours as needed for wheezing or shortness of breath.   Yes Historical Provider, MD  Aspirin-Acetaminophen-Caffeine (EXCEDRIN EXTRA STRENGTH PO) Take 2 tablets by mouth every 6 (six) hours as needed (pain).   Yes Historical Provider, MD  Tetrahydroz-Polyvinyl Al-Povid (MURINE TEARS PLUS) 0.05-0.5-0.6 % SOLN Place 1-3 drops into the left eye as needed (eye irritation and dryness).   Yes Historical Provider, MD  meloxicam (MOBIC) 7.5 MG tablet Take 2 tablets (15 mg total) by mouth daily. 11/29/13   Antonietta Breach, PA-C  traMADol (ULTRAM) 50 MG tablet Take 1 tablet (50 mg total) by mouth every 6 (six) hours as needed. 11/29/13   Antonietta Breach, PA-C   BP 139/75  Pulse 93  Temp(Src) 97.4 F (36.3 C) (Oral)  Resp 17  Ht 5\' 4"  (1.626 m)  Wt 284 lb (128.822 kg)  BMI 48.72 kg/m2  SpO2 94%  LMP 11/21/2013  Physical Exam  Nursing note and vitals reviewed. Constitutional: She is oriented to person, place, and time. She appears well-developed and well-nourished. No distress.  Nontoxic/nonseptic appearing.  HENT:  Head: Normocephalic and atraumatic.  Eyes: Conjunctivae and EOM are normal. No scleral icterus.  Neck: Normal range of motion.  Patient moves neck with ease.  Cardiovascular:  Normal rate, regular rhythm and intact distal pulses.   Distal radial pulses 2+ b/l  Pulmonary/Chest: Effort normal. No respiratory distress. She has no wheezes.  Musculoskeletal:  Mild decreased ROM of L shoulder secondary to pain. No crepitus, deformity, or effusions appreciated. No bony tenderness appreciated. Patient does have tenderness to palpation appreciated to her left rotator cuff.   Neurological: She is alert and oriented to person, place, and time. She exhibits normal muscle tone. Coordination normal.  Normal grip strength bilaterally. Normal strength against resistance in bilateral upper extremities. Normal sensation to light touch.  Skin: Skin is warm and dry. No rash noted. She is not diaphoretic. No erythema. No pallor.  Psychiatric: She has a normal mood and affect. Her behavior is normal.    ED Course  Procedures (including critical care time) Labs Review Labs Reviewed - No data to display  Imaging Review Dg Shoulder Left  11/29/2013   CLINICAL DATA:  Worsening left shoulder pain.  EXAM: LEFT SHOULDER - 2+ VIEW  COMPARISON:  None.  FINDINGS: There is no evidence of fracture or dislocation. The left humeral head is seated within the glenoid fossa. The acromioclavicular joint is unremarkable in appearance. No significant soft tissue abnormalities are seen. The visualized portions of the left lung are clear.  IMPRESSION: No evidence of fracture or dislocation.   Electronically Signed   By: Garald Balding M.D.   On: 11/29/2013 05:01     EKG Interpretation None      MDM   Final diagnoses:  Shoulder pain, left  Rotator cuff strain    Uncomplicated left shoulder pain. Symptoms likely secondary to strain of the rotator cuff secondary to frequent heavy lifting and repetitive movements as patient works as a [employer] manager. She is neurovascularly intact on physical exam with normal sensation to light touch. No evidence of septic joint and x-ray negative for fracture or dislocation. Patient given Percocet in ED with significant improvement in symptoms. She will be discharged with prescription for Mobic and Ultram for breakthrough pain as needed. Return precautions discussed and provided. Patient agreeable to plan with no unaddressed concerns.  Filed Vitals:   11/28/13 2348  BP: 139/75  Pulse: 93  Temp: 97.4 F (36.3 C)  TempSrc: Oral  Resp: 17  Height: 5\' 4"  (1.626 m)   Weight: 284 lb (128.822 kg)  SpO2: 94%       Antonietta Breach, PA-C 12/02/13 1345

## 2013-12-07 NOTE — ED Provider Notes (Signed)
Medical screening examination/treatment/procedure(s) were performed by non-physician practitioner and as supervising physician I was immediately available for consultation/collaboration.   EKG Interpretation None       Elyn Peers, MD 12/07/13 [phone]

## 2014-05-25 ENCOUNTER — Emergency Department (HOSPITAL_COMMUNITY)
Admission: EM | Admit: 2014-05-25 | Discharge: 2014-05-25 | Disposition: A | Discharge status: Home / Self Care | Payer: 59 | Attending: Emergency Medicine | Admitting: Emergency Medicine

## 2014-05-25 ENCOUNTER — Encounter (HOSPITAL_COMMUNITY): Payer: Self-pay | Admitting: Emergency Medicine

## 2014-05-25 DIAGNOSIS — G43909 Migraine, unspecified, not intractable, without status migrainosus: Secondary | ICD-10-CM | POA: Insufficient documentation

## 2014-05-25 DIAGNOSIS — G8929 Other chronic pain: Secondary | ICD-10-CM | POA: Insufficient documentation

## 2014-05-25 DIAGNOSIS — K011 Impacted teeth: Secondary | ICD-10-CM | POA: Insufficient documentation

## 2014-05-25 DIAGNOSIS — R110 Nausea: Secondary | ICD-10-CM | POA: Diagnosis not present

## 2014-05-25 DIAGNOSIS — Z79899 Other long term (current) drug therapy: Secondary | ICD-10-CM | POA: Insufficient documentation

## 2014-05-25 DIAGNOSIS — K088 Other specified disorders of teeth and supporting structures: Secondary | ICD-10-CM | POA: Diagnosis present

## 2014-05-25 DIAGNOSIS — K089 Disorder of teeth and supporting structures, unspecified: Secondary | ICD-10-CM

## 2014-05-25 MED ORDER — HYDROCODONE-ACETAMINOPHEN 5-325 MG PO TABS: 1.0000 | ORAL_TABLET | Freq: Four times a day (QID) | ORAL | Status: DC | PRN

## 2014-05-25 MED ORDER — HYDROCODONE-ACETAMINOPHEN 5-325 MG PO TABS
1.0000 | ORAL_TABLET | Freq: Once | ORAL | Status: AC
  Administered 2014-05-25: 1 via ORAL
  Filled 2014-05-25: qty 1

## 2014-05-25 MED ORDER — NAPROXEN 500 MG PO TABS
500.0000 mg | ORAL_TABLET | Freq: Two times a day (BID) | ORAL | Status: DC | PRN
Start: 2014-05-25 — End: 2016-01-02

## 2014-05-25 NOTE — Discharge Instructions (Signed)
Apply warm compresses to jaw throughout the day. Take naprosyn and norco as directed, as needed for pain but do not drive or operate machinery with pain medication use. Followup with a dentist is very important for ongoing evaluation and management of recurrent dental pain. Call the dentist you've been referred to above, or use the list below to find one. Return to emergency department for emergent changing or worsening symptoms.  Emergency Department Resource Guide 1) Find a Doctor and Pay Out of Pocket Although you won't have to find out who is covered by your insurance plan, it is a good idea to ask around and get recommendations. You will then need to call the office and see if the doctor you have chosen will accept you as a new patient and what types of options they offer for patients who are self-pay. Some doctors offer discounts or will set up payment plans for their patients who do not have insurance, but you will need to ask so you aren't surprised when you get to your appointment.  2) Contact Your Local Health Department Not all health departments have doctors that can see patients for sick visits, but many do, so it is worth a call to see if yours does. If you don't know where your local health department is, you can check in your phone book. The CDC also has a tool to help you locate your state's health department, and many state websites also have listings of all of their local health departments.  3) Find a [facility] If your illness is not likely to be very severe or complicated, you may want to try a walk in clinic. These are popping up all over the country in pharmacies, drugstores, and shopping centers. They're usually staffed by nurse practitioners or physician assistants that have been trained to treat common illnesses and complaints. They're usually fairly quick and inexpensive. However, if you have serious medical issues or chronic medical problems, these are probably not your  best option.  No Primary Care Doctor: - Call Health Connect at  [phone] - they can help you locate a primary care doctor that  accepts your insurance, provides certain services, etc. - Physician Referral Service- [phone]  Chronic Pain Problems: Organization         Address  Phone   Notes  [facility]  [phone] Patients need to be referred by their primary care doctor.   Medication Assistance: Organization         Address  Phone   Notes  [facility] Medication [facility] [address] [place], [place] [address] [phone] --Must be a resident of [facility] -- Must have NO insurance coverage whatsoever (no Medicaid/ Medicare, etc.) -- The pt. MUST have a primary care doctor that directs their care regularly and follows them in the community   [facility]  [phone]   [place]  [phone]     Dental Care: Organization         Address  Phone  Notes  [facility] Department of [facility] [phone] Accepts children up to age 1 who are enrolled in [place] or [place]; pregnant women with a Medicaid card; and children who have applied for Medicaid or [place] Health Choice, but were declined, whose parents can pay a reduced fee at time of service.  [place]  [address], [facility] [phone] Accepts children up to age 29 who are enrolled in Medicaid or [place]; pregnant women with a Medicaid card; and children who have applied for Medicaid or [place] Health Choice, but were declined, whose parents can pay a reduced fee at time of service.  [place] [facility]  [place] [phone] Patients are seen by appointment only. Walk-ins are not accepted. [place] will see patients 21 years of age and older. Monday - Tuesday (8am-5pm) Most  Wednesdays (8:30-5pm) $30 per visit, cash only  [facility] Adult Dental Access PROGRAM  [address], [facility] [phone] Patients are seen by appointment only. Walk-ins are not accepted. [place] will see patients 14 years of age and older. One Wednesday Evening (Monthly: Volunteer Based).  $30 per visit, cash only  [place]  [phone] for adults; Children under age 57, call Graduate Pediatric Dentistry at [phone]. Children aged 64-14, please call [phone] to request a pediatric application.  Dental services are provided in all areas of dental care including fillings, crowns and bridges, complete and partial dentures, implants, gum treatment, root canals, and extractions. Preventive care is also provided. Treatment is provided to both adults and children. Patients are selected via a lottery and there is often a waiting list.   [facility] [address], [place]  [phone] [email or web address]   [facility] [address] [place], [place] [phone], Ext. [phone] Second and Fourth Thursday of each month, opens at 6:30 AM; Clinic ends at 9 AM.  Patients are seen on a first-come first-served basis, and a limited number are seen during each clinic.   [facility]  [address] Hillard Danker [place], [place] [phone]   Eligibility Requirements You must have lived in [place], [place], or [place] counties for at least the last three months.   You cannot be eligible for state or federal sponsored [employer], including [employer], [place], or [employer].   You generally cannot be eligible for healthcare insurance through your employer.    How to apply: Eligibility screenings are held every Tuesday and Wednesday afternoon from 1:00 pm until 4:00 pm. You do not need an appointment for the interview!  [facility] [address], [place], [place]     [place]  [place] Department  [place] Department  [phone]      Dental Pain Toothache is pain in or around a tooth. It may get worse with chewing or with cold or heat.  HOME CARE  Your dentist may use a numbing medicine during treatment. If so, you may need to avoid eating until the medicine wears off. Ask your dentist about this.  Only take medicine as told by your dentist or doctor.  Avoid chewing food near the painful tooth until after all treatment is done. Ask your dentist about this. GET HELP RIGHT AWAY IF:   The problem gets worse or new problems appear.  You have a fever.  There is redness and puffiness (swelling) of the face, jaw, or neck.  You cannot open your mouth.  There is pain in the jaw.  There is very bad pain that is not helped by medicine. MAKE SURE YOU:   Understand these instructions.  Will watch your condition.  Will get help right away if you are not  doing well or get worse. Document Released: 12/11/2007 Document Revised: 09/16/2011 Document Reviewed: 12/11/2007 [facility] Patient Information 2015 [place], [place]. This information is not intended to replace advice given to you by your health care provider. Make sure you discuss any questions you have with your health care provider.  Impacted Molar Molars are the teeth in the back of your mouth. You have 12 molars. There are 6 molars in each jaw, 3 on each side. When they grow in (erupt) they sometimes cause problems. Molars trapped inside the gum are impacted molars. Impacted molars may grow sideways, tilted, or may only partially emerge. Molars erupt at different times in life. The first set of molars usually erupts around 93 to 34 years of age. The second set of molars typically erupts around 69 to 34 years of age. The third set of molars are called wisdom teeth. These molars usually do not have enough space to  erupt properly. Many teens and young adults develop impacted wisdom teeth and have them surgically removed (extracted). However, any molar or set of molars may become impacted. CAUSES  Teeth that are crowded are often the reason for an impacted molar, but sometimes a cyst or tumor may cause impaction of molars. SYMPTOMS  Sometimes there are no symptoms and an impacted molar is noticed during an exam or X-ray. If there are symptoms they may include:  Pain.  Swelling, redness, or inflammation near the impacted tooth or teeth.  Stiff jaw.  General feeling of illness.  Bad breath.  Gap between the teeth.  Difficulty opening your mouth.  Headache or jaw ache.  Swollen lymph nodes. Impacted teeth may increase the risk of complications such as:  Infection, with possible drainage around the infected area.  Damage to nearby teeth.  Growth of cysts.  Chronic discomfort. DIAGNOSIS  Impacted molars are diagnosed by oral exam and X-rays. TREATMENT  The goal of treatment is to obtain the best possible arrangement of your teeth. Your dentist or orthodontist will recommend the best course of action for you. After an exam, your caregiver may recommend one or a combination of the following treatments.  Supportive home care to manage pain and other symptoms until treatment can be started.  Surgical extraction of one or a combination of molars to leave room for emerging or later molars. Teeth must be extracted at appropriate times for the best results.  Surgical uncovering of tissue covering the impacted molar.  Orthodontic repositioning with the use of appliances such as elastic or metal separators, braces, wires, springs, and other removable or fixed devices. This is done to guide the molar and surrounding teeth to grow in properly. In some cases, you may need some surgery to assist this procedure. Follow-up orthodontic treatment is often necessary with impacted first and second  molars.  Antibiotics to treat infection. HOME CARE INSTRUCTIONS Rinse as directed with an antibacterial solution or salt and warm water. Follow up with your caregiver as directed, even if you do not have symptoms. If you are waiting for treatment and have pain:  Take pain medicines as directed.  Take your antibiotics as directed. Finish them even if you start to feel better.  Put ice on the affected area.  Put ice in a plastic bag.  Place a towel between your skin and the bag.  Leave the ice on for 15-20 minutes, 03-04 times a day. SEEK DENTAL CARE IF:  You have a fever.  Pain emerges, worsens, or is not controlled by the medicines  you were given.  Swelling occurs.  You have difficulty opening your mouth or swallowing. MAKE SURE YOU:   Understand these instructions.  Will watch your condition.  Will get help right away if you are not doing well or get worse. Document Released: 02/20/2011 Document Revised: 09/16/2011 Document Reviewed: 02/20/2011 [facility] Patient Information 2015 [place], [place]. This information is not intended to replace advice given to you by your health care provider. Make sure you discuss any questions you have with your health care provider.

## 2014-05-25 NOTE — ED Notes (Signed)
Pt. reports persistent left upper and lower molar pain for 2 years unrelieved by OTC pain medications .

## 2014-05-25 NOTE — ED Provider Notes (Signed)
CSN: [id number]     Arrival date & time 05/25/14  2151 History   None    This chart was scribed for non-physician practitioner, Zacarias Pontes PA-C working with Pamella Pert, MD by Forrestine Him, ED Scribe. This patient was seen in room TR08C/TR08C and the patient's care was started at 9:55 PM.   No chief complaint on file.  Patient is a 33 y.o. female presenting with tooth pain. The history is provided by the patient. No language interpreter was used.  Dental Pain Location:  Upper and lower Upper teeth location:  16/LU 3rd molar Lower teeth location:  32/RL 3rd molar and 17/LL 3rd molar Quality:  Throbbing Severity:  Severe Onset quality:  Gradual Duration:  2 years Timing:  Constant Progression:  Worsening Chronicity:  New Context comment:  Wisdom tooth impaction Previous work-up:  Dental exam Relieved by:  Nothing Worsened by:  Cold food/drink and pressure Ineffective treatments:  Acetaminophen, NSAIDs and topical anesthetic gel Associated symptoms: facial pain   Associated symptoms: no congestion, no difficulty swallowing, no drooling, no facial swelling, no fever, no gum swelling, no headaches, no neck pain, no neck swelling, no oral bleeding, no oral lesions and no trismus   Risk factors: no smoking     HPI Comments: Donna Kelley is a 33 y.o. female with a PMHx of migraines, who presents to the Emergency Department complaining of constant, moderate L upper and L molar pain x 2 years. She states she was told by a dentist she needed her wisdom teeth pulled but she couldn't afford it. Currently pain is rated 10/10 and described as throbbing. Pain radiates into the L side of her face. Pain is exacerbated with cold air and chewing. No alleviating factors at this time. She has tried OTC Ibuprofen, Tylenol, Goody Powders, and topical Orajel without any improvement for symptoms. She denies any congestion, difficulty swallowing, drooling,  facial swelling,  fever,  gum  swelling, headaches, neck pain, neck swelling, oral bleeding,  oral lesions, or trismus. No CP, SOB, abd pain, n/v, or paresthesias. Pt with known allergy to Erythromycin. Nonsmoker.  Past Medical History  Diagnosis Date  . Migraines    Past Surgical History  Procedure Laterality Date  . Tubal ligation     No family history on file. History  Substance Use Topics  . Smoking status: Never Smoker   . Smokeless tobacco: Not on file  . Alcohol Use: Yes     Comment: stated has few drinks every sev. mopnths   OB History    No data available     Review of Systems  Constitutional: Negative for fever and chills.  HENT: Positive for dental problem. Negative for congestion, drooling, ear discharge, ear pain, facial swelling, mouth sores, sinus pressure and trouble swallowing.   Eyes: Negative for pain, discharge and itching.  Respiratory: Negative for shortness of breath.   Cardiovascular: Negative for chest pain.  Gastrointestinal: Positive for nausea (due to pain). Negative for vomiting and abdominal pain.  Musculoskeletal: Negative for neck pain and neck stiffness.  Skin: Negative for color change.  Neurological: Negative for weakness, numbness and headaches.    A complete 10 system review of systems was obtained and all systems are negative except as noted in the HPI and PMH.    Allergies  Shellfish allergy and Erythromycin  Home Medications   Prior to Admission medications   Medication Sig Start Date End Date Taking? Authorizing Provider  albuterol (PROVENTIL HFA;VENTOLIN HFA) 108 (90 BASE) MCG/ACT inhaler  Inhale 2 puffs into the lungs every 6 (six) hours as needed for wheezing or shortness of breath.    Historical Provider, MD  Aspirin-Acetaminophen-Caffeine (EXCEDRIN EXTRA STRENGTH PO) Take 2 tablets by mouth every 6 (six) hours as needed (pain).    Historical Provider, MD  meloxicam (MOBIC) 7.5 MG tablet Take 2 tablets (15 mg total) by mouth daily. 11/29/13   Antonietta Breach, PA-C   Tetrahydroz-Polyvinyl Al-Povid (MURINE TEARS PLUS) 0.05-0.5-0.6 % SOLN Place 1-3 drops into the left eye as needed (eye irritation and dryness).    Historical Provider, MD  traMADol (ULTRAM) 50 MG tablet Take 1 tablet (50 mg total) by mouth every 6 (six) hours as needed. 11/29/13   Antonietta Breach, PA-C   Triage Vitals: BP 162/98 mmHg  Pulse 70  Temp(Src) 98.2 F (36.8 C) (Oral)  Resp 18  Ht 5\' 5"  (1.651 m)  Wt 284 lb 9.6 oz (129.094 kg)  BMI 47.36 kg/m2  SpO2 96%  LMP 04/26/2014   Physical Exam  Constitutional: She is oriented to person, place, and time. Vital signs are normal. She appears well-developed and well-nourished.  Non-toxic appearance. She appears distressed.  Baseline HTN noted but otherwise VSS, afebrile nontoxic, appears in pain  HENT:  Head: Normocephalic and atraumatic.  Mouth/Throat: Uvula is midline, oropharynx is clear and moist and mucous membranes are normal. No trismus in the jaw. Normal dentition. No dental abscesses, uvula swelling or dental caries.    TTP over gums behind LU molar #15 and LL molar #18 where her wisdom molars #16 and 17 have not erupted. No swelling or erythema, no discharge.   Eyes: Conjunctivae and EOM are normal. Right eye exhibits no discharge. Left eye exhibits no discharge.  Neck: Normal range of motion. Neck supple.  Cardiovascular: Normal rate.   Pulmonary/Chest: Effort normal. No respiratory distress.  Abdominal: Normal appearance. She exhibits no distension.  Musculoskeletal: Normal range of motion.  Lymphadenopathy:       Head (right side): No submandibular and no tonsillar adenopathy present.       Head (left side): No submandibular and no tonsillar adenopathy present.    She has no cervical adenopathy.  No LAD  Neurological: She is alert and oriented to person, place, and time. She has normal strength. No sensory deficit.  Skin: Skin is warm, dry and intact. No rash noted. No erythema.  Psychiatric: She has a normal mood and  affect.  Nursing note and vitals reviewed.   ED Course  Procedures (including critical care time)  DIAGNOSTIC STUDIES: Oxygen Saturation is 95% on RA, adequate by my interpretation.    COORDINATION OF CARE: 9:54 PM- Advised pt to follow up with dentist to have wisdom teeth removed. Discussed treatment plan with pt at bedside and pt agreed to plan.     Labs Review Labs Reviewed - No data to display  Imaging Review No results found.   EKG Interpretation None      MDM   Final diagnoses:  Impacted third molar tooth  Chronic dental pain    33y/o female with dental pain associated with known wisdom tooth impaction but no eruption of tooth or s/sx of infection, with patient afebrile, non toxic appearing and swallowing secretions well. Oral pain meds given here and rx for home. I gave patient referral to dentist and stressed the importance of dental follow up for ultimate management of dental pain.  I have also discussed reasons to return immediately to the ER.  Patient expresses understanding and agrees with  plan. Doubt need for abx at this time.   I personally performed the services described in this documentation, which was scribed in my presence. The recorded information has been reviewed and is accurate.  BP 162/98 mmHg  Pulse 70  Temp(Src) 98.2 F (36.8 C) (Oral)  Resp 18  Ht 5\' 5"  (1.651 m)  Wt 284 lb 9.6 oz (129.094 kg)  BMI 47.36 kg/m2  SpO2 96%  LMP 04/26/2014  Meds ordered this encounter  Medications  . HYDROcodone-acetaminophen (NORCO/VICODIN) 5-325 MG per tablet 1 tablet    Sig:   . naproxen (NAPROSYN) 500 MG tablet    Sig: Take 1 tablet (500 mg total) by mouth 2 (two) times daily as needed for mild pain, moderate pain or headache (TAKE WITH MEALS.).    Dispense:  20 tablet    Refill:  0    Order Specific Question:  Supervising Provider    Answer:  Noemi Chapel D [[id number]]  . HYDROcodone-acetaminophen (NORCO) 5-325 MG per tablet    Sig: Take 1-2 tablets by  mouth every 6 (six) hours as needed for severe pain.    Dispense:  6 tablet    Refill:  0    Order Specific Question:  Supervising Provider    Answer:  Johnna Acosta [address] Garramone Camprubi-Soms, PA-C 05/25/14 2316  Pamella Pert, MD 05/25/14 [phone]

## 2014-05-25 NOTE — ED Notes (Signed)
Pt A&OX4, ambulatory at d/c with steady gait, NAD 

## 2015-06-25 ENCOUNTER — Encounter (HOSPITAL_COMMUNITY): Payer: Self-pay | Admitting: *Deleted

## 2015-06-25 ENCOUNTER — Emergency Department (HOSPITAL_COMMUNITY)
Admission: EM | Admit: 2015-06-25 | Discharge: 2015-06-25 | Disposition: A | Discharge status: Home / Self Care | Payer: 59 | Attending: Emergency Medicine | Admitting: Emergency Medicine

## 2015-06-25 DIAGNOSIS — E669 Obesity, unspecified: Secondary | ICD-10-CM | POA: Insufficient documentation

## 2015-06-25 DIAGNOSIS — Z79899 Other long term (current) drug therapy: Secondary | ICD-10-CM | POA: Diagnosis not present

## 2015-06-25 DIAGNOSIS — G43809 Other migraine, not intractable, without status migrainosus: Secondary | ICD-10-CM

## 2015-06-25 DIAGNOSIS — R21 Rash and other nonspecific skin eruption: Secondary | ICD-10-CM | POA: Diagnosis present

## 2015-06-25 MED ORDER — PROMETHAZINE HCL 25 MG PO TABS: 25.0000 mg | ORAL_TABLET | Freq: Four times a day (QID) | ORAL | Status: DC | PRN

## 2015-06-25 MED ORDER — DIPHENHYDRAMINE HCL 50 MG/ML IJ SOLN
25.0000 mg | Freq: Once | INTRAMUSCULAR | Status: AC
  Administered 2015-06-25: 25 mg via INTRAVENOUS
  Filled 2015-06-25: qty 1

## 2015-06-25 MED ORDER — ONDANSETRON 4 MG PO TBDP
ORAL_TABLET | ORAL | Status: AC
  Filled 2015-06-25: qty 1

## 2015-06-25 MED ORDER — PROCHLORPERAZINE EDISYLATE 5 MG/ML IJ SOLN
10.0000 mg | Freq: Once | INTRAMUSCULAR | Status: AC
  Administered 2015-06-25: 10 mg via INTRAVENOUS
  Filled 2015-06-25: qty 2

## 2015-06-25 MED ORDER — SODIUM CHLORIDE 0.9 % IV BOLUS (SEPSIS)
500.0000 mL | Freq: Once | INTRAVENOUS | Status: AC
  Administered 2015-06-25: 500 mL via INTRAVENOUS

## 2015-06-25 MED ORDER — KETOROLAC TROMETHAMINE 30 MG/ML IJ SOLN
30.0000 mg | Freq: Once | INTRAMUSCULAR | Status: AC
  Administered 2015-06-25: 30 mg via INTRAVENOUS
  Filled 2015-06-25: qty 1

## 2015-06-25 MED ORDER — DEXAMETHASONE SODIUM PHOSPHATE 10 MG/ML IJ SOLN
10.0000 mg | Freq: Once | INTRAMUSCULAR | Status: AC
  Administered 2015-06-25: 10 mg via INTRAVENOUS
  Filled 2015-06-25: qty 1

## 2015-06-25 MED ORDER — ONDANSETRON 4 MG PO TBDP
4.0000 mg | ORAL_TABLET | Freq: Once | ORAL | Status: AC
  Administered 2015-06-25: 4 mg via ORAL

## 2015-06-25 NOTE — Discharge Instructions (Signed)
Recurrent Migraine Headache °A migraine headache is very bad, throbbing pain on one or both sides of your head. Recurrent migraines keep coming back. Talk to your doctor about what things may bring on (trigger) your migraine headaches. °HOME CARE °· Only take medicines as told by your doctor. °· Lie down in a dark, quiet room when you have a migraine. °· Keep a journal to find out if certain things bring on migraine headaches. For example, write down: °¨ What you eat and drink. °¨ How much sleep you get. °¨ Any change to your diet or medicines. °· Lessen how much alcohol you drink. °· Quit smoking if you smoke. °· Get enough sleep. °· Lessen any stress in your life. °· Keep lights dim if bright lights bother you or make your migraines worse. °GET HELP IF: °· Medicine does not help your migraines. °· Your pain keeps coming back. °· You have a fever. °GET HELP RIGHT AWAY IF:  °· Your migraine becomes really bad. °· You have a stiff neck. °· You have trouble seeing. °· Your muscles are weak, or you lose muscle control. °· You lose your balance or have trouble walking. °· You feel like you will pass out (faint), or you pass out. °· You have really bad symptoms that are different than your first symptoms. °MAKE SURE YOU:  °· Understand these instructions. °· Will watch your condition. °· Will get help right away if you are not doing well or get worse. °  °This information is not intended to replace advice given to you by your health care provider. Make sure you discuss any questions you have with your health care provider. °  °Document Released: 04/02/2008 Document Revised: 06/29/2013 Document Reviewed: 03/01/2013 °Elsevier Interactive Patient Education ©2016 [employer] ° °

## 2015-06-25 NOTE — ED Provider Notes (Signed)
CSN: [id number]     Arrival date & time 06/25/15  0304 History   First MD Initiated Contact with Patient 06/25/15 [phone]     Chief Complaint  Patient presents with  . Migraine     (Consider location/radiation/quality/duration/timing/severity/associated sxs/prior Treatment) HPI Comments: Patient presents to the ER for evaluation of migraine headache. Patient reports that her symptoms began at approximately 10:30 PM. She reports being awakened from sleep with a throbbing right-sided headache. Patient endorses nausea without vomiting. She has photophobia. This is similar to previous migraines that she has had in the past.  Patient is a 34 y.o. female presenting with migraines.  Migraine Associated symptoms include headaches.    Past Medical History  Diagnosis Date  . Migraines   . Obesity    Past Surgical History  Procedure Laterality Date  . Tubal ligation     No family history on file. Social History  Substance Use Topics  . Smoking status: Never Smoker   . Smokeless tobacco: Never Used  . Alcohol Use: Yes     Comment: stated has few drinks every sev. mopnths   OB History    No data available     Review of Systems  Neurological: Positive for headaches.  All other systems reviewed and are negative.     Allergies  Shellfish allergy and Erythromycin  Home Medications   Prior to Admission medications   Medication Sig Start Date End Date Taking? Authorizing Provider  albuterol (PROVENTIL HFA;VENTOLIN HFA) 108 (90 BASE) MCG/ACT inhaler Inhale 2 puffs into the lungs every 6 (six) hours as needed for wheezing or shortness of breath.    Historical Provider, MD  Aspirin-Acetaminophen-Caffeine (EXCEDRIN EXTRA STRENGTH PO) Take 2 tablets by mouth every 6 (six) hours as needed (pain).    Historical Provider, MD  HYDROcodone-acetaminophen (NORCO) 5-325 MG per tablet Take 1-2 tablets by mouth every 6 (six) hours as needed for severe pain. 05/25/14   Mercedes Camprubi-Soms, PA-C   ibuprofen (ADVIL,MOTRIN) 600 MG tablet Take 600 mg by mouth 4 (four) times daily as needed. 05/06/14   Historical Provider, MD  meloxicam (MOBIC) 7.5 MG tablet Take 2 tablets (15 mg total) by mouth daily. Patient not taking: Reported on 05/25/2014 11/29/13   Antonietta Breach, PA-C  naproxen (NAPROSYN) 500 MG tablet Take 1 tablet (500 mg total) by mouth 2 (two) times daily as needed for mild pain, moderate pain or headache (TAKE WITH MEALS.). 05/25/14   Mercedes Camprubi-Soms, PA-C  traMADol (ULTRAM) 50 MG tablet Take 1 tablet (50 mg total) by mouth every 6 (six) hours as needed. Patient not taking: Reported on 05/25/2014 11/29/13   Antonietta Breach, PA-C   BP 158/95 mmHg  Pulse 87  Temp(Src) 98.8 F (37.1 C)  Resp 16  Ht 5\' 5"  (1.651 m)  Wt 290 lb (131.543 kg)  BMI 48.26 kg/m2  SpO2 98%  LMP 06/10/2015 Physical Exam  Constitutional: She is oriented to person, place, and time. She appears well-developed and well-nourished. No distress.  HENT:  Head: Normocephalic and atraumatic.  Right Ear: Hearing normal.  Left Ear: Hearing normal.  Nose: Nose normal.  Mouth/Throat: Oropharynx is clear and moist and mucous membranes are normal.  Eyes: Conjunctivae and EOM are normal. Pupils are equal, round, and reactive to light.  Neck: Normal range of motion. Neck supple.  Cardiovascular: Regular rhythm, S1 normal and S2 normal.  Exam reveals no gallop and no friction rub.   No murmur heard. Pulmonary/Chest: Effort normal and breath sounds normal. No respiratory  distress. She exhibits no tenderness.  Abdominal: Soft. Normal appearance and bowel sounds are normal. There is no hepatosplenomegaly. There is no tenderness. There is no rebound, no guarding, no tenderness at McBurney's point and negative Murphy's sign. No hernia.  Musculoskeletal: Normal range of motion.  Neurological: She is alert and oriented to person, place, and time. She has normal strength. No cranial nerve deficit or sensory deficit.  Coordination normal. GCS eye subscore is 4. GCS verbal subscore is 5. GCS motor subscore is 6.  Extraocular muscle movement: normal No visual field cut Pupils: equal and reactive both direct and consensual response is normal No nystagmus present    Sensory function is intact to light touch, pinprick Proprioception intact  Grip strength 5/5 symmetric in upper extremities No pronator drift Normal finger to nose bilaterally  Lower extremity strength 5/5 against gravity Normal heel to shin bilaterally  Gait: normal   Skin: Skin is warm, dry and intact. No rash noted. No cyanosis.  Psychiatric: She has a normal mood and affect. Her speech is normal and behavior is normal. Thought content normal.  Nursing note and vitals reviewed.   ED Course  Procedures (including critical care time) Labs Review Labs Reviewed - No data to display  Imaging Review No results found. I have personally reviewed and evaluated these images and lab results as part of my medical decision-making.   EKG Interpretation None      MDM   Final diagnoses:  None   migraine headache  Patient presents to the emergency department for evaluation of headache. Patient reports that her headache started around 10:30 PM. She has a right sided throbbing headache with nausea and photosensitivity. She does have a history of recurrent migraines. Patient had a normal, unremarkable neurologic exam. Patient has had resolution of her headache with migraine cocktail.    Orpah Greek, MD 06/25/15 [phone]

## 2015-06-25 NOTE — ED Notes (Signed)
Pt verbalized understanding of d/c instructions and has no further questions. Pt stable and NAD.  

## 2015-06-25 NOTE — ED Notes (Signed)
Patient presents with c/o migraine HA that started approx 1030. +photophobia, nausea

## 2015-09-06 HISTORY — PX: WISDOM TOOTH EXTRACTION: SHX21

## 2015-12-29 ENCOUNTER — Emergency Department (HOSPITAL_COMMUNITY)
Admission: EM | Admit: 2015-12-29 | Discharge: 2015-12-29 | Disposition: A | Discharge status: Home / Self Care | Payer: 59 | Attending: Dermatology | Admitting: Dermatology

## 2015-12-29 ENCOUNTER — Ambulatory Visit (HOSPITAL_COMMUNITY)
Admission: EM | Admit: 2015-12-29 | Discharge: 2015-12-29 | Disposition: A | Discharge status: Nursing Home (Includes ICF) | Payer: 59 | Attending: Family Medicine | Admitting: Family Medicine

## 2015-12-29 ENCOUNTER — Encounter (HOSPITAL_COMMUNITY): Payer: Self-pay | Admitting: Emergency Medicine

## 2015-12-29 DIAGNOSIS — R1030 Lower abdominal pain, unspecified: Secondary | ICD-10-CM | POA: Diagnosis not present

## 2015-12-29 DIAGNOSIS — M549 Dorsalgia, unspecified: Secondary | ICD-10-CM | POA: Insufficient documentation

## 2015-12-29 DIAGNOSIS — R109 Unspecified abdominal pain: Secondary | ICD-10-CM | POA: Diagnosis not present

## 2015-12-29 DIAGNOSIS — Z5321 Procedure and treatment not carried out due to patient leaving prior to being seen by health care provider: Secondary | ICD-10-CM | POA: Insufficient documentation

## 2015-12-29 LAB — POCT URINALYSIS DIP (DEVICE)
Bilirubin Urine: NEGATIVE
GLUCOSE, UA: NEGATIVE mg/dL
HGB URINE DIPSTICK: NEGATIVE
Ketones, ur: NEGATIVE mg/dL
LEUKOCYTES UA: NEGATIVE
NITRITE: NEGATIVE
Protein, ur: NEGATIVE mg/dL
Specific Gravity, Urine: 1.02 (ref 1.005–1.030)
UROBILINOGEN UA: 1 mg/dL (ref 0.0–1.0)
pH: 7 (ref 5.0–8.0)

## 2015-12-29 LAB — COMPREHENSIVE METABOLIC PANEL
ALK PHOS: 52 U/L (ref 38–126)
ALT: 14 U/L (ref 14–54)
ANION GAP: 5 (ref 5–15)
AST: 23 U/L (ref 15–41)
Albumin: 3.8 g/dL (ref 3.5–5.0)
BUN: 7 mg/dL (ref 6–20)
CALCIUM: 9.1 mg/dL (ref 8.9–10.3)
CO2: 27 mmol/L (ref 22–32)
Chloride: 104 mmol/L (ref 101–111)
Creatinine, Ser: 0.75 mg/dL (ref 0.44–1.00)
Glucose, Bld: 89 mg/dL (ref 65–99)
Potassium: 3.6 mmol/L (ref 3.5–5.1)
SODIUM: 136 mmol/L (ref 135–145)
Total Bilirubin: 0.2 mg/dL — ABNORMAL LOW (ref 0.3–1.2)
Total Protein: 7.3 g/dL (ref 6.5–8.1)

## 2015-12-29 LAB — CBC
HEMATOCRIT: 29.6 % — AB (ref 36.0–46.0)
HEMOGLOBIN: 8.9 g/dL — AB (ref 12.0–15.0)
MCH: 24.8 pg — AB (ref 26.0–34.0)
MCHC: 30.1 g/dL (ref 30.0–36.0)
MCV: 82.5 fL (ref 78.0–100.0)
Platelets: 421 10*3/uL — ABNORMAL HIGH (ref 150–400)
RBC: 3.59 MIL/uL — AB (ref 3.87–5.11)
RDW: 17.1 % — AB (ref 11.5–15.5)
WBC: 6.4 10*3/uL (ref 4.0–10.5)

## 2015-12-29 LAB — LIPASE, BLOOD: LIPASE: 19 U/L (ref 11–51)

## 2015-12-29 LAB — POCT PREGNANCY, URINE: Preg Test, Ur: NEGATIVE

## 2015-12-29 NOTE — ED Notes (Signed)
Pt c/o intermittent abd/back pain onset x2 days Denies fevers, chills, urinary sx A&O x4... No acute distress.

## 2015-12-29 NOTE — ED Notes (Signed)
Pt here with sharp lower abdominal pain with aching in her bacck. Pt denies n/v/d, fever, urinary symptoms. Pt was seen at [facility] today.

## 2015-12-29 NOTE — ED Notes (Signed)
Pt requesting to leave. Pt A/O x 4, in NAD. Pt advised to return for worsening symptoms.

## 2015-12-29 NOTE — ED Provider Notes (Signed)
CSN: [id number]     Arrival date & time 12/29/15  1750 History   First MD Initiated Contact with Patient 12/29/15 1821     Chief Complaint  Patient presents with  . Abdominal Pain  . Back Pain   (Consider location/radiation/quality/duration/timing/severity/associated sxs/prior Treatment) Patient is a 35 y.o. female presenting with abdominal pain and back pain. The history is provided by the patient.  Abdominal Pain Pain location:  L flank and R flank Pain quality: cramping, sharp and stiffness   Pain radiates to:  Suprapubic region Pain severity:  Moderate (8/10 level.) Duration:  2 days Progression:  Worsening Chronicity:  New Context: not alcohol use   Relieved by:  Acetaminophen Associated symptoms: no chills, no diarrhea, no fever, no nausea, no vaginal bleeding, no vaginal discharge and no vomiting   Back Pain Associated symptoms: abdominal pain   Associated symptoms: no fever     Past Medical History  Diagnosis Date  . Migraines   . Obesity    Past Surgical History  Procedure Laterality Date  . Tubal ligation     No family history on file. Social History  Substance Use Topics  . Smoking status: Never Smoker   . Smokeless tobacco: Never Used  . Alcohol Use: Yes     Comment: stated has few drinks every sev. mopnths   OB History    No data available     Review of Systems  Constitutional: Negative.  Negative for fever and chills.  Gastrointestinal: Positive for abdominal pain. Negative for nausea, vomiting and diarrhea.  Genitourinary: Negative.  Negative for vaginal bleeding and vaginal discharge.  Musculoskeletal: Positive for back pain.  All other systems reviewed and are negative.   Allergies  Shellfish allergy and Erythromycin  Home Medications   Prior to Admission medications   Medication Sig Start Date End Date Taking? Authorizing Provider  albuterol (PROVENTIL HFA;VENTOLIN HFA) 108 (90 BASE) MCG/ACT inhaler Inhale 2 puffs into the lungs every 6  (six) hours as needed for wheezing or shortness of breath.    Historical Provider, MD  Aspirin-Acetaminophen-Caffeine (EXCEDRIN EXTRA STRENGTH PO) Take 2 tablets by mouth every 6 (six) hours as needed (pain).    Historical Provider, MD  HYDROcodone-acetaminophen (NORCO) 5-325 MG per tablet Take 1-2 tablets by mouth every 6 (six) hours as needed for severe pain. Patient not taking: Reported on 06/25/2015 05/25/14   Mercedes Camprubi-Soms, PA-C  meloxicam (MOBIC) 7.5 MG tablet Take 2 tablets (15 mg total) by mouth daily. Patient not taking: Reported on 05/25/2014 11/29/13   Antonietta Breach, PA-C  naproxen (NAPROSYN) 500 MG tablet Take 1 tablet (500 mg total) by mouth 2 (two) times daily as needed for mild pain, moderate pain or headache (TAKE WITH MEALS.). Patient not taking: Reported on 06/25/2015 05/25/14   Mercedes Camprubi-Soms, PA-C  promethazine (PHENERGAN) 25 MG tablet Take 1 tablet (25 mg total) by mouth every 6 (six) hours as needed (migraine). 06/25/15   Orpah Greek, MD  traMADol (ULTRAM) 50 MG tablet Take 1 tablet (50 mg total) by mouth every 6 (six) hours as needed. Patient not taking: Reported on 05/25/2014 11/29/13   Antonietta Breach, PA-C   Meds Ordered and Administered this Visit  Medications - No data to display  BP 167/114 mmHg  Pulse 86  Temp(Src) 99.2 F (37.3 C) (Oral)  Resp 16  SpO2 100%  LMP 12/09/2015 No data found.   Physical Exam  Constitutional: She is oriented to person, place, and time. She appears well-developed and well-nourished.  No distress.  Neck: Normal range of motion. Neck supple.  Cardiovascular: Normal rate, normal heart sounds and intact distal pulses.   Abdominal: Soft. Bowel sounds are normal. She exhibits no distension and no mass. There is tenderness. There is no rebound and no guarding.  Lymphadenopathy:    She has no cervical adenopathy.  Neurological: She is alert and oriented to person, place, and time.  Skin: Skin is warm and dry.   Nursing note and vitals reviewed.   ED Course  Procedures (including critical care time)  Labs Review Labs Reviewed  POCT URINALYSIS DIP (DEVICE)  POCT PREGNANCY, URINE   U/a and upreg neg.  Imaging Review No results found.   Visual Acuity Review  Right Eye Distance:   Left Eye Distance:   Bilateral Distance:    Right Eye Near:   Left Eye Near:    Bilateral Near:         MDM   1. Abdominal pain in female patient       Billy Fischer, MD 12/29/15 1900

## 2016-01-02 ENCOUNTER — Encounter (HOSPITAL_COMMUNITY): Payer: Self-pay | Admitting: *Deleted

## 2016-01-02 ENCOUNTER — Inpatient Hospital Stay (HOSPITAL_COMMUNITY)
Admission: AD | Admit: 2016-01-02 | Discharge: 2016-01-02 | Disposition: A | Discharge status: Home / Self Care | Payer: 59 | Source: Ambulatory Visit | Attending: Obstetrics & Gynecology | Admitting: Obstetrics & Gynecology

## 2016-01-02 DIAGNOSIS — N760 Acute vaginitis: Secondary | ICD-10-CM | POA: Diagnosis not present

## 2016-01-02 DIAGNOSIS — R109 Unspecified abdominal pain: Secondary | ICD-10-CM | POA: Diagnosis present

## 2016-01-02 DIAGNOSIS — G43909 Migraine, unspecified, not intractable, without status migrainosus: Secondary | ICD-10-CM | POA: Diagnosis not present

## 2016-01-02 DIAGNOSIS — B9689 Other specified bacterial agents as the cause of diseases classified elsewhere: Secondary | ICD-10-CM

## 2016-01-02 DIAGNOSIS — E669 Obesity, unspecified: Secondary | ICD-10-CM | POA: Insufficient documentation

## 2016-01-02 DIAGNOSIS — R102 Pelvic and perineal pain: Secondary | ICD-10-CM

## 2016-01-02 DIAGNOSIS — I10 Essential (primary) hypertension: Secondary | ICD-10-CM | POA: Insufficient documentation

## 2016-01-02 DIAGNOSIS — M545 Low back pain: Secondary | ICD-10-CM | POA: Diagnosis present

## 2016-01-02 LAB — URINALYSIS, ROUTINE W REFLEX MICROSCOPIC
BILIRUBIN URINE: NEGATIVE
Glucose, UA: NEGATIVE mg/dL
HGB URINE DIPSTICK: NEGATIVE
KETONES UR: NEGATIVE mg/dL
Leukocytes, UA: NEGATIVE
NITRITE: NEGATIVE
PROTEIN: NEGATIVE mg/dL
SPECIFIC GRAVITY, URINE: 1.025 (ref 1.005–1.030)
pH: 6 (ref 5.0–8.0)

## 2016-01-02 LAB — WET PREP, GENITAL
Sperm: NONE SEEN
Trich, Wet Prep: NONE SEEN
Yeast Wet Prep HPF POC: NONE SEEN

## 2016-01-02 LAB — POCT PREGNANCY, URINE: PREG TEST UR: NEGATIVE

## 2016-01-02 LAB — HCG, SERUM, QUALITATIVE: PREG SERUM: NEGATIVE

## 2016-01-02 MED ORDER — HYDROCHLOROTHIAZIDE 25 MG PO TABS: 25.0000 mg | ORAL_TABLET | Freq: Every day | ORAL | Status: DC

## 2016-01-02 MED ORDER — METRONIDAZOLE 500 MG PO TABS
500.0000 mg | ORAL_TABLET | Freq: Two times a day (BID) | ORAL | Status: DC
Start: 2016-01-02 — End: 2016-03-29

## 2016-01-02 NOTE — Discharge Instructions (Signed)

## 2016-01-02 NOTE — MAU Note (Signed)
Pt. States that she started having sharp cramping pain in her lower abdomen and lower back pain for 4 days now. Pt. Denies having any bleeding.

## 2016-01-02 NOTE — MAU Provider Note (Signed)
Chief Complaint: Abdominal Pain   None     SUBJECTIVE HPI: Donna Kelley is a 35 y.o. [phone] with a history of anemia, presents to maternity admissions reporting abdominal and back pain x4-5 days. She describes the pain as sharp and intermittent, in her lower abdomen on both sides and in her lower back, increasing when she moves, decreasing when she rests.  She has tried ibuprofen and tylenol for pain and they both help but do not relieve the pain.  Patient's last menstrual period was 12/09/2015.  She has regular periods.  She reports a hx of pelvic pain with periods but states this pain is new and different, and is occuring when she is not having her period.  She was seen in the ED on 6/23 for this pain and reports she was mostly concerned that she could be pregnant but has had a tubal ligation.  She is still concerned that her pain could be an ectopic pregnancy. She denies vaginal bleeding, vaginal itching/burning, urinary symptoms, h/a, dizziness, n/v, or fever/chills.     HPI  Past Medical History  Diagnosis Date  . Migraines   . Obesity    Past Surgical History  Procedure Laterality Date  . Tubal ligation    . Nose surgery     Social History   Social History  . Marital Status: Single    Spouse Name: N/A  . Number of Children: N/A  . Years of Education: N/A   Occupational History  . Not on file.   Social History Main Topics  . Smoking status: Never Smoker   . Smokeless tobacco: Never Used  . Alcohol Use: Yes     Comment: stated has few drinks every sev. mopnths  . Drug Use: No  . Sexual Activity: Yes    Birth Control/ Protection: Surgical   Other Topics Concern  . Not on file   Social History Narrative   No current facility-administered medications on file prior to encounter.   Current Outpatient Prescriptions on File Prior to Encounter  Medication Sig Dispense Refill  . Aspirin-Acetaminophen-Caffeine (EXCEDRIN EXTRA STRENGTH PO) Take 2 tablets by mouth  every 6 (six) hours as needed (pain).    Marland Kitchen albuterol (PROVENTIL HFA;VENTOLIN HFA) 108 (90 BASE) MCG/ACT inhaler Inhale 2 puffs into the lungs every 6 (six) hours as needed for wheezing or shortness of breath. Reported on 01/02/2016     Allergies  Allergen Reactions  . Shellfish Allergy Anaphylaxis  . Erythromycin Rash    ROS:  Review of Systems  Constitutional: Negative for fever, chills and fatigue.  Respiratory: Negative for shortness of breath.   Cardiovascular: Negative for chest pain, palpitations and leg swelling.  Gastrointestinal: Positive for abdominal pain. Negative for nausea and vomiting.  Genitourinary: Positive for pelvic pain. Negative for dysuria, flank pain, vaginal bleeding, vaginal discharge, difficulty urinating and vaginal pain.  Neurological: Negative for dizziness, weakness, light-headedness and headaches.  Psychiatric/Behavioral: Negative.      I have reviewed patient's Past Medical Hx, Surgical Hx, Family Hx, Social Hx, medications and allergies.   Physical Exam   Patient Vitals for the past 24 hrs:  BP Temp Temp src Pulse Height Weight  01/02/16 1913 (!) 161/104 mmHg - - 75 - -  01/02/16 1905 - 98.1 F (36.7 C) Oral - 5\' 5"  (1.651 m) 283 lb 8 oz (128.595 kg)   Constitutional: Well-developed, well-nourished female in no acute distress.  Cardiovascular: normal rate Respiratory: normal effort GI: Abd soft, non-tender. Pos BS x 4  MS: Extremities nontender, no edema, normal ROM Speculum exam: Vagina - Small amount of creamy discharge, no odor Cervix - No contact bleeding Bimanual exam: Cervix closed, No CMT  Uterus non tender, normal size Mild left Adnexal tenderness, no masses bilaterally Neurologic: Alert and oriented x 4.  GU: Neg CVAT.   LAB RESULTS Results for orders placed or performed during the hospital encounter of 01/02/16 (from the past 24 hour(s))  Urinalysis, Routine w reflex microscopic (not at [facility])     Status: None   Collection  Time: 01/02/16  6:50 PM  Result Value Ref Range   Color, Urine YELLOW YELLOW   APPearance CLEAR CLEAR   Specific Gravity, Urine 1.025 1.005 - 1.030   pH 6.0 5.0 - 8.0   Glucose, UA NEGATIVE NEGATIVE mg/dL   Hgb urine dipstick NEGATIVE NEGATIVE   Bilirubin Urine NEGATIVE NEGATIVE   Ketones, ur NEGATIVE NEGATIVE mg/dL   Protein, ur NEGATIVE NEGATIVE mg/dL   Nitrite NEGATIVE NEGATIVE   Leukocytes, UA NEGATIVE NEGATIVE  Pregnancy, urine POC     Status: None   Collection Time: 01/02/16  7:02 PM  Result Value Ref Range   Preg Test, Ur NEGATIVE NEGATIVE  hCG, serum, qualitative     Status: None   Collection Time: 01/02/16  8:18 PM  Result Value Ref Range   Preg, Serum NEGATIVE NEGATIVE  Wet prep, genital     Status: Abnormal   Collection Time: 01/02/16  8:23 PM  Result Value Ref Range   Yeast Wet Prep HPF POC NONE SEEN NONE SEEN   Trich, Wet Prep NONE SEEN NONE SEEN   Clue Cells Wet Prep HPF POC PRESENT (A) NONE SEEN   WBC, Wet Prep HPF POC MODERATE (A) NONE SEEN   Sperm NONE SEEN        MDM: UPT negative, reviewed results with pt.  serum pregnancy test pending.  Report to Noni Saupe, NP  Patient is afebrile, no rebound of significant tenderness on exam. WBC on 6/23 was normal. Discussed patient with Dr. Benjie Karvonen, patient encouraged to follow up with GYN this week for appointment in the office.  GC pending  Patient had a normal WBC left on 6/23    Medication List    ASK your doctor about these medications        albuterol 108 (90 Base) MCG/ACT inhaler  Commonly known as:  PROVENTIL HFA;VENTOLIN HFA  Inhale 2 puffs into the lungs every 6 (six) hours as needed for wheezing or shortness of breath. Reported on 01/02/2016     EXCEDRIN EXTRA STRENGTH PO  Take 2 tablets by mouth every 6 (six) hours as needed (pain).     multivitamin with minerals tablet  Take 1 tablet by mouth daily.        Fatima Blank Certified Nurse-Midwife 01/02/2016  8:06 PM   A:  1.  Pelvic pain in female   2. BV (bacterial vaginosis)   3. Benign essential HTN     P:  Discharge home in stable condition Patient highly encouraged to seek care with PCP for management of HTN Reviewed stroke/MI warning signs; patient to go to [facility] ED with any of these symptoms Rx: HCTZ, Flagyl Follow up with [place] for further care.  Return to [facility] if symptoms worsen Continue PO iron at home   Lezlie Lye, NP 01/02/2016 10:29 PM

## 2016-01-03 LAB — GC/CHLAMYDIA PROBE AMP (~~LOC~~) NOT AT ARMC
CHLAMYDIA, DNA PROBE: NEGATIVE
Neisseria Gonorrhea: NEGATIVE

## 2016-01-10 DIAGNOSIS — Z3161 Procreative counseling and advice using natural family planning: Secondary | ICD-10-CM | POA: Diagnosis not present

## 2016-01-10 DIAGNOSIS — Z319 Encounter for procreative management, unspecified: Secondary | ICD-10-CM | POA: Diagnosis not present

## 2016-01-10 DIAGNOSIS — N924 Excessive bleeding in the premenopausal period: Secondary | ICD-10-CM | POA: Diagnosis not present

## 2016-01-10 DIAGNOSIS — D250 Submucous leiomyoma of uterus: Secondary | ICD-10-CM | POA: Diagnosis not present

## 2016-01-10 DIAGNOSIS — N971 Female infertility of tubal origin: Secondary | ICD-10-CM | POA: Diagnosis not present

## 2016-01-11 DIAGNOSIS — I10 Essential (primary) hypertension: Secondary | ICD-10-CM | POA: Diagnosis not present

## 2016-01-11 DIAGNOSIS — G43109 Migraine with aura, not intractable, without status migrainosus: Secondary | ICD-10-CM | POA: Diagnosis not present

## 2016-01-11 DIAGNOSIS — Z13220 Encounter for screening for lipoid disorders: Secondary | ICD-10-CM | POA: Diagnosis not present

## 2016-01-11 DIAGNOSIS — Z91013 Allergy to seafood: Secondary | ICD-10-CM | POA: Diagnosis not present

## 2016-01-11 DIAGNOSIS — Z79899 Other long term (current) drug therapy: Secondary | ICD-10-CM | POA: Diagnosis not present

## 2016-01-12 DIAGNOSIS — Z319 Encounter for procreative management, unspecified: Secondary | ICD-10-CM | POA: Diagnosis not present

## 2016-02-16 DIAGNOSIS — I10 Essential (primary) hypertension: Secondary | ICD-10-CM | POA: Diagnosis not present

## 2016-02-16 DIAGNOSIS — Z713 Dietary counseling and surveillance: Secondary | ICD-10-CM | POA: Diagnosis not present

## 2016-02-16 DIAGNOSIS — Z6841 Body Mass Index (BMI) 40.0 and over, adult: Secondary | ICD-10-CM | POA: Diagnosis not present

## 2016-03-14 NOTE — H&P (Signed)
Donna Kelley is a 35 y.o. female , originally referred to me by Dr. Gustavo Lah, [facility], for uterine fibroid.  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 Patient would like to preserve her childbearing potential.  Pertinent Gynecological History: Menses: Heavy Bleeding: dysfunctional uterine bleeding Contraception: none DES exposure: denies Blood transfusions: none Sexually transmitted diseases: no past history Previous GYN Procedures: Tubal Ligation  Last mammogram: normal Last pap: normal  OB History: G3, P3   Menstrual History: Menarche age: 60 No LMP recorded.    Past Medical History:  Diagnosis Date  . Migraines   . Obesity                     Past Surgical History:  Procedure Laterality Date  . NOSE SURGERY    . TUBAL LIGATION               No family history on file. No hereditary disease.  No cancer of breast, ovary, uterus. No cutaneous leiomyomatosis or renal cell carcinoma.  Social History   Social History  . Marital status: Single    Spouse name: N/A  . Number of children: N/A  . Years of education: N/A   Occupational History  . Not on file.   Social History Main Topics  . Smoking status: Never Smoker  . Smokeless tobacco: Never Used  . Alcohol use Yes     Comment: stated has few drinks every sev. mopnths  . Drug use: No  . Sexual activity: Yes    Birth control/ protection: Surgical   Other Topics Concern  . Not on file   Social History Narrative  . No narrative on file    Allergies  Allergen Reactions  . Shellfish Allergy Anaphylaxis  . Erythromycin Rash    No current facility-administered medications on file prior to encounter.    Current Outpatient Prescriptions on File Prior to Encounter  Medication Sig Dispense Refill  . albuterol (PROVENTIL HFA;VENTOLIN HFA) 108 (90 BASE) MCG/ACT inhaler Inhale 2 puffs into the lungs  every 6 (six) hours as needed for wheezing or shortness of breath. Reported on 01/02/2016    . Aspirin-Acetaminophen-Caffeine (EXCEDRIN EXTRA STRENGTH PO) Take 2 tablets by mouth every 6 (six) hours as needed (pain).    . hydrochlorothiazide (HYDRODIURIL) 25 MG tablet Take 1 tablet (25 mg total) by mouth daily. 30 tablet 1  . metroNIDAZOLE (FLAGYL) 500 MG tablet Take 1 tablet (500 mg total) by mouth 2 (two) times daily. 14 tablet 0  . Multiple Vitamins-Minerals (MULTIVITAMIN WITH MINERALS) tablet Take 1 tablet by mouth daily.       Review of Systems  Constitutional: Negative.   HENT: Negative.   Eyes: Negative.   Respiratory: Negative.   Cardiovascular: Negative.   Gastrointestinal: Negative.   Genitourinary: Negative.   Musculoskeletal: Negative.   Skin: Negative.   Neurological: Negative.   Endo/Heme/Allergies: Negative.   Psychiatric/Behavioral: Negative.      Physical Exam  There were no vitals taken for this visit. Constitutional: She is oriented to person, place, and time. She appears well-developed and well-nourished.  HENT:  Head: Normocephalic and atraumatic.  Nose: Nose normal.  Mouth/Throat: Oropharynx is clear and moist. No oropharyngeal exudate.  Eyes: Conjunctivae normal and EOM are normal. Pupils are equal, round, and reactive to light. No scleral icterus.  Neck: Normal range of motion. Neck supple. No tracheal deviation present. No thyromegaly present.  Cardiovascular: Normal rate.   Respiratory: Effort normal and breath sounds normal.  GI: Soft. Bowel sounds are normal. She exhibits no distension and no mass. There is no tenderness.  Lymphadenopathy:    She has no cervical adenopathy.  Neurological: She is alert and oriented to person, place, and time. She has normal reflexes.  Skin: Skin is warm.  Psychiatric: She has a normal mood and affect. Her behavior is normal. Judgment and thought content normal.       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  Preoperative myomectomy Benefits and risks of myomectomy were discussed with the patient and her family member again.  Bowel prep instructions were given.  All of patient's questions were answered.  She verbalized understanding.  She knows that she will need a cesarean delivery for future pregnancies, and that it is recommended she does not conceive for 2-3 months for uterus to heal.

## 2016-03-29 ENCOUNTER — Encounter (HOSPITAL_BASED_OUTPATIENT_CLINIC_OR_DEPARTMENT_OTHER): Payer: Self-pay | Admitting: *Deleted

## 2016-03-29 DIAGNOSIS — D251 Intramural leiomyoma of uterus: Secondary | ICD-10-CM | POA: Diagnosis not present

## 2016-03-29 NOTE — Progress Notes (Addendum)
NPO AFTER MN WITH EXCEPTION CLEAR LIQUIDS UNTIL 0700 (NO CREAM/ MILK PRODUCTS).  NEEDS ISTAT AND EKG.

## 2016-04-02 ENCOUNTER — Ambulatory Visit (HOSPITAL_BASED_OUTPATIENT_CLINIC_OR_DEPARTMENT_OTHER): Payer: 59 | Admitting: Anesthesiology

## 2016-04-02 ENCOUNTER — Ambulatory Visit (HOSPITAL_BASED_OUTPATIENT_CLINIC_OR_DEPARTMENT_OTHER)
Admission: RE | Admit: 2016-04-02 | Discharge: 2016-04-02 | Disposition: A | Discharge status: Home / Self Care | Payer: 59 | Source: Ambulatory Visit | Attending: Obstetrics and Gynecology | Admitting: Obstetrics and Gynecology

## 2016-04-02 ENCOUNTER — Encounter (HOSPITAL_BASED_OUTPATIENT_CLINIC_OR_DEPARTMENT_OTHER)
Admission: RE | Disposition: A | Discharge status: Home / Self Care | Payer: Self-pay | Source: Ambulatory Visit | Attending: Obstetrics and Gynecology

## 2016-04-02 ENCOUNTER — Encounter (HOSPITAL_BASED_OUTPATIENT_CLINIC_OR_DEPARTMENT_OTHER): Payer: Self-pay | Admitting: *Deleted

## 2016-04-02 DIAGNOSIS — Z6841 Body Mass Index (BMI) 40.0 and over, adult: Secondary | ICD-10-CM | POA: Insufficient documentation

## 2016-04-02 DIAGNOSIS — D251 Intramural leiomyoma of uterus: Secondary | ICD-10-CM | POA: Diagnosis not present

## 2016-04-02 DIAGNOSIS — G43909 Migraine, unspecified, not intractable, without status migrainosus: Secondary | ICD-10-CM | POA: Diagnosis not present

## 2016-04-02 DIAGNOSIS — N920 Excessive and frequent menstruation with regular cycle: Secondary | ICD-10-CM | POA: Diagnosis not present

## 2016-04-02 DIAGNOSIS — D250 Submucous leiomyoma of uterus: Secondary | ICD-10-CM | POA: Insufficient documentation

## 2016-04-02 DIAGNOSIS — I10 Essential (primary) hypertension: Secondary | ICD-10-CM | POA: Insufficient documentation

## 2016-04-02 DIAGNOSIS — Z79899 Other long term (current) drug therapy: Secondary | ICD-10-CM | POA: Insufficient documentation

## 2016-04-02 DIAGNOSIS — D259 Leiomyoma of uterus, unspecified: Secondary | ICD-10-CM | POA: Diagnosis not present

## 2016-04-02 DIAGNOSIS — N938 Other specified abnormal uterine and vaginal bleeding: Secondary | ICD-10-CM | POA: Diagnosis not present

## 2016-04-02 HISTORY — DX: Leiomyoma of uterus, unspecified: D25.9

## 2016-04-02 HISTORY — PX: LAPAROSCOPIC GELPORT ASSISTED MYOMECTOMY: SHX6549

## 2016-04-02 HISTORY — DX: Essential (primary) hypertension: I10

## 2016-04-02 HISTORY — DX: Anemia, unspecified: D64.9

## 2016-04-02 LAB — POCT I-STAT, CHEM 8
BUN: 6 mg/dL (ref 6–20)
CALCIUM ION: 1.21 mmol/L (ref 1.15–1.40)
CHLORIDE: 102 mmol/L (ref 101–111)
Creatinine, Ser: 0.6 mg/dL (ref 0.44–1.00)
Glucose, Bld: 77 mg/dL (ref 65–99)
HCT: 38 % (ref 36.0–46.0)
Hemoglobin: 12.9 g/dL (ref 12.0–15.0)
POTASSIUM: 4.1 mmol/L (ref 3.5–5.1)
SODIUM: 143 mmol/L (ref 135–145)
TCO2: 27 mmol/L (ref 0–100)

## 2016-04-02 SURGERY — LAPAROSCOPIC GELPORT ASSISTED MYOMECTOMY
Anesthesia: General | Site: Abdomen

## 2016-04-02 MED ORDER — PROPOFOL 10 MG/ML IV BOLUS
INTRAVENOUS | Status: AC
  Filled 2016-04-02: qty 20

## 2016-04-02 MED ORDER — FENTANYL CITRATE (PF) 100 MCG/2ML IJ SOLN
INTRAMUSCULAR | Status: AC
  Filled 2016-04-02: qty 2

## 2016-04-02 MED ORDER — BUPIVACAINE-EPINEPHRINE 0.5% -1:200000 IJ SOLN
INTRAMUSCULAR | Status: DC | PRN
  Administered 2016-04-02: 10 mL

## 2016-04-02 MED ORDER — ROCURONIUM BROMIDE 100 MG/10ML IV SOLN
INTRAVENOUS | Status: DC | PRN
  Administered 2016-04-02: 30 mg via INTRAVENOUS
  Administered 2016-04-02: 10 mg via INTRAVENOUS

## 2016-04-02 MED ORDER — DEXAMETHASONE SODIUM PHOSPHATE 4 MG/ML IJ SOLN
INTRAMUSCULAR | Status: DC | PRN
  Administered 2016-04-02: 10 mg via INTRAVENOUS

## 2016-04-02 MED ORDER — LIDOCAINE HCL (CARDIAC) 20 MG/ML IV SOLN
INTRAVENOUS | Status: DC | PRN
  Administered 2016-04-02: 100 mg via INTRAVENOUS

## 2016-04-02 MED ORDER — NEOSTIGMINE METHYLSULFATE 10 MG/10ML IV SOLN
INTRAVENOUS | Status: DC | PRN
  Administered 2016-04-02: 4 mg via INTRAVENOUS

## 2016-04-02 MED ORDER — GLYCOPYRROLATE 0.2 MG/ML IJ SOLN
INTRAMUSCULAR | Status: DC | PRN
Start: 2016-04-02 — End: 2016-04-02
  Administered 2016-04-02: 0.6 mg via INTRAVENOUS

## 2016-04-02 MED ORDER — SUCCINYLCHOLINE CHLORIDE 20 MG/ML IJ SOLN
INTRAMUSCULAR | Status: DC | PRN
  Administered 2016-04-02: 140 mg via INTRAVENOUS

## 2016-04-02 MED ORDER — CEFAZOLIN SODIUM-DEXTROSE 2-4 GM/100ML-% IV SOLN
2.0000 g | INTRAVENOUS | Status: DC
  Filled 2016-04-02: qty 100

## 2016-04-02 MED ORDER — OXYCODONE-ACETAMINOPHEN 5-325 MG PO TABS
ORAL_TABLET | ORAL | Status: AC
  Filled 2016-04-02: qty 1

## 2016-04-02 MED ORDER — MIDAZOLAM HCL 5 MG/5ML IJ SOLN
INTRAMUSCULAR | Status: DC | PRN
  Administered 2016-04-02: 2 mg via INTRAVENOUS

## 2016-04-02 MED ORDER — GLYCOPYRROLATE 0.2 MG/ML IV SOSY
PREFILLED_SYRINGE | INTRAVENOUS | Status: AC
  Filled 2016-04-02: qty 3

## 2016-04-02 MED ORDER — NEOSTIGMINE METHYLSULFATE 5 MG/5ML IV SOSY
PREFILLED_SYRINGE | INTRAVENOUS | Status: AC
  Filled 2016-04-02: qty 5

## 2016-04-02 MED ORDER — LACTATED RINGERS IV SOLN
INTRAVENOUS | Status: DC
  Administered 2016-04-02 (×3): via INTRAVENOUS
  Filled 2016-04-02: qty 1000

## 2016-04-02 MED ORDER — PROPOFOL 10 MG/ML IV BOLUS
INTRAVENOUS | Status: DC | PRN
  Administered 2016-04-02: 200 mg via INTRAVENOUS

## 2016-04-02 MED ORDER — LACTATED RINGERS IR SOLN
Status: DC | PRN
  Administered 2016-04-02: 3000 mL

## 2016-04-02 MED ORDER — OXYCODONE-ACETAMINOPHEN 5-325 MG PO TABS
1.0000 | ORAL_TABLET | Freq: Four times a day (QID) | ORAL | Status: DC | PRN
  Administered 2016-04-02: 1 via ORAL
  Filled 2016-04-02: qty 1

## 2016-04-02 MED ORDER — FENTANYL CITRATE (PF) 100 MCG/2ML IJ SOLN
INTRAMUSCULAR | Status: DC | PRN
  Administered 2016-04-02 (×6): 50 ug via INTRAVENOUS

## 2016-04-02 MED ORDER — ONDANSETRON HCL 4 MG/2ML IJ SOLN
INTRAMUSCULAR | Status: DC | PRN
  Administered 2016-04-02: 4 mg via INTRAVENOUS

## 2016-04-02 MED ORDER — CEFAZOLIN SODIUM-DEXTROSE 2-4 GM/100ML-% IV SOLN
INTRAVENOUS | Status: AC
  Filled 2016-04-02: qty 100

## 2016-04-02 MED ORDER — MIDAZOLAM HCL 2 MG/2ML IJ SOLN
INTRAMUSCULAR | Status: AC
  Filled 2016-04-02: qty 2

## 2016-04-02 MED ORDER — ROCURONIUM BROMIDE 10 MG/ML (PF) SYRINGE
PREFILLED_SYRINGE | INTRAVENOUS | Status: AC
  Filled 2016-04-02: qty 10

## 2016-04-02 MED ORDER — CEFAZOLIN IN D5W 1 GM/50ML IV SOLN
INTRAVENOUS | Status: AC
  Filled 2016-04-02: qty 50

## 2016-04-02 MED ORDER — DEXAMETHASONE SODIUM PHOSPHATE 10 MG/ML IJ SOLN
INTRAMUSCULAR | Status: AC
  Filled 2016-04-02: qty 1

## 2016-04-02 MED ORDER — DEXTROSE 5 % IV SOLN
3.0000 g | Freq: Once | INTRAVENOUS | Status: AC
  Administered 2016-04-02: 3 g via INTRAVENOUS
  Filled 2016-04-02: qty 3000

## 2016-04-02 MED ORDER — LIDOCAINE 2% (20 MG/ML) 5 ML SYRINGE
INTRAMUSCULAR | Status: AC
  Filled 2016-04-02: qty 5

## 2016-04-02 MED ORDER — METOCLOPRAMIDE HCL 5 MG/ML IJ SOLN
INTRAMUSCULAR | Status: DC | PRN
  Administered 2016-04-02: 10 mg via INTRAVENOUS

## 2016-04-02 MED ORDER — METHYLENE BLUE 0.5 % INJ SOLN
INTRAVENOUS | Status: DC | PRN
  Administered 2016-04-02: 2 mL via SUBMUCOSAL

## 2016-04-02 MED ORDER — KETOROLAC TROMETHAMINE 30 MG/ML IJ SOLN
INTRAMUSCULAR | Status: DC | PRN
  Administered 2016-04-02: 30 mg via INTRAVENOUS

## 2016-04-02 MED ORDER — PROMETHAZINE HCL 25 MG/ML IJ SOLN
6.2500 mg | INTRAMUSCULAR | Status: DC | PRN
Start: 2016-04-02 — End: 2016-04-02
  Filled 2016-04-02: qty 1

## 2016-04-02 MED ORDER — METOCLOPRAMIDE HCL 5 MG/ML IJ SOLN
INTRAMUSCULAR | Status: AC
  Filled 2016-04-02: qty 2

## 2016-04-02 MED ORDER — FENTANYL CITRATE (PF) 100 MCG/2ML IJ SOLN
25.0000 ug | INTRAMUSCULAR | Status: DC | PRN
Start: 2016-04-02 — End: 2016-04-02
  Administered 2016-04-02 (×3): 25 ug via INTRAVENOUS
  Filled 2016-04-02: qty 1

## 2016-04-02 MED ORDER — ONDANSETRON HCL 4 MG/2ML IJ SOLN
INTRAMUSCULAR | Status: AC
  Filled 2016-04-02: qty 2

## 2016-04-02 SURGICAL SUPPLY — 72 items
APPLICATOR COTTON TIP 6IN STRL (MISCELLANEOUS) ×2 IMPLANT
BLADE SURG 10 STRL SS (BLADE) ×2 IMPLANT
BLADE SURG 11 STRL SS (BLADE) ×2 IMPLANT
COVER MAYO STAND STRL (DRAPES) ×2 IMPLANT
DEVICE TROCAR PUNCTURE CLOSURE (ENDOMECHANICALS) IMPLANT
DRAPE UNDERBUTTOCKS STRL (DRAPE) ×2 IMPLANT
DRSG COVADERM PLUS 2X2 (GAUZE/BANDAGES/DRESSINGS) ×2 IMPLANT
DRSG OPSITE POSTOP 3X4 (GAUZE/BANDAGES/DRESSINGS) ×2 IMPLANT
DRSG TEGADERM 4X4.75 (GAUZE/BANDAGES/DRESSINGS) IMPLANT
DRSG TELFA 3X8 NADH (GAUZE/BANDAGES/DRESSINGS) IMPLANT
ELECT BLADE 6.5 .24CM SHAFT (ELECTRODE) IMPLANT
ELECT NEEDLE TIP 2.8 STRL (NEEDLE) IMPLANT
ELECT REM PT RETURN 9FT ADLT (ELECTROSURGICAL) ×2
ELECTRODE REM PT RTRN 9FT ADLT (ELECTROSURGICAL) ×1 IMPLANT
GLOVE BIO SURGEON STRL SZ 6.5 (GLOVE) ×2 IMPLANT
GLOVE BIO SURGEON STRL SZ8 (GLOVE) ×2 IMPLANT
GLOVE BIOGEL PI IND STRL 8.5 (GLOVE) ×1 IMPLANT
GLOVE BIOGEL PI INDICATOR 8.5 (GLOVE) ×1
GLOVE INDICATOR 6.5 STRL GRN (GLOVE) ×2 IMPLANT
GLOVE INDICATOR 7.5 STRL GRN (GLOVE) ×8 IMPLANT
GOWN STRL REUS W/ TWL XL LVL3 (GOWN DISPOSABLE) ×1 IMPLANT
GOWN STRL REUS W/TWL LRG LVL3 (GOWN DISPOSABLE) ×2 IMPLANT
GOWN STRL REUS W/TWL XL LVL3 (GOWN DISPOSABLE) ×3 IMPLANT
KIT ROOM TURNOVER WOR (KITS) ×2 IMPLANT
LEGGING LITHOTOMY PAIR STRL (DRAPES) ×2 IMPLANT
LIQUID BAND (GAUZE/BANDAGES/DRESSINGS) ×2 IMPLANT
MANIPULATOR UTERINE 4.5 ZUMI (MISCELLANEOUS) ×2 IMPLANT
NDL SAFETY ECLIPSE 18X1.5 (NEEDLE) ×1 IMPLANT
NEEDLE HYPO 18GX1.5 SHARP (NEEDLE) ×1
NEEDLE HYPO 25X1 1.5 SAFETY (NEEDLE) ×2 IMPLANT
NEEDLE INSUFFLATION 14GA 120MM (NEEDLE) ×2 IMPLANT
NS IRRIG 1000ML POUR BTL (IV SOLUTION) ×2 IMPLANT
PACK BASIN DAY SURGERY FS (CUSTOM PROCEDURE TRAY) ×2 IMPLANT
PACK LAPAROSCOPY II (CUSTOM PROCEDURE TRAY) ×2 IMPLANT
PAD ION RIGHT ARM DISP (MISCELLANEOUS) ×2 IMPLANT
PAD OB MATERNITY 4.3X12.25 (PERSONAL CARE ITEMS) ×2 IMPLANT
PENCIL BUTTON HOLSTER BLD 10FT (ELECTRODE) ×2 IMPLANT
SCALPEL HARMONIC ACE (MISCELLANEOUS) IMPLANT
SEALER TISSUE G2 CVD JAW 35 (ENDOMECHANICALS) IMPLANT
SEALER TISSUE G2 CVD JAW 45CM (ENDOMECHANICALS) IMPLANT
SEPRAFILM MEMBRANE 5X6 (MISCELLANEOUS) IMPLANT
SET IRRIG TUBING LAPAROSCOPIC (IRRIGATION / IRRIGATOR) ×2 IMPLANT
SOLUTION ANTI FOG 6CC (MISCELLANEOUS) ×2 IMPLANT
SPONGE LAP 18X18 X RAY DECT (DISPOSABLE) IMPLANT
STRIP CLOSURE SKIN 1/2X4 (GAUZE/BANDAGES/DRESSINGS) ×2 IMPLANT
SUT MNCRL AB 4-0 PS2 18 (SUTURE) ×2 IMPLANT
SUT PROLENE 0 CT 1 30 (SUTURE) IMPLANT
SUT VIC AB 0 CT1 36 (SUTURE) IMPLANT
SUT VIC AB 2-0 CT1 (SUTURE) ×6 IMPLANT
SUT VIC AB 2-0 CT1 27 (SUTURE) ×2
SUT VIC AB 2-0 CT1 TAPERPNT 27 (SUTURE) ×2 IMPLANT
SUT VIC AB 2-0 CT2 27 (SUTURE) IMPLANT
SUT VIC AB 2-0 UR6 27 (SUTURE) ×2 IMPLANT
SUT VIC AB 4-0 SH 27 (SUTURE) ×2
SUT VIC AB 4-0 SH 27XANBCTRL (SUTURE) ×2 IMPLANT
SUT VICRYL 0 TIES 12 18 (SUTURE) IMPLANT
SYR 20CC LL (SYRINGE) IMPLANT
SYR 30ML LL (SYRINGE) ×2 IMPLANT
SYR 3ML 23GX1 SAFETY (SYRINGE) ×2 IMPLANT
SYR 5ML LL (SYRINGE) ×2 IMPLANT
SYR CONTROL 10ML LL (SYRINGE) ×6 IMPLANT
SYRINGE 10CC LL (SYRINGE) ×2 IMPLANT
SYS LAPSCP GELPORT 120MM (MISCELLANEOUS) ×2
SYSTEM LAPSCP GELPORT 120MM (MISCELLANEOUS) ×1 IMPLANT
TOWEL OR 17X24 6PK STRL BLUE (TOWEL DISPOSABLE) ×4 IMPLANT
TRAY DSU PREP LF (CUSTOM PROCEDURE TRAY) ×2 IMPLANT
TRAY FOLEY CATH SILVER 14FR (SET/KITS/TRAYS/PACK) ×2 IMPLANT
TROCAR OPTI TIP 5M 100M (ENDOMECHANICALS) ×2 IMPLANT
TROCAR XCEL DIL TIP R 11M (ENDOMECHANICALS) IMPLANT
TUBE CONNECTING 12X1/4 (SUCTIONS) IMPLANT
TUBING INSUFFLATION 10FT LAP (TUBING) ×4 IMPLANT
WARMER LAPAROSCOPE (MISCELLANEOUS) ×2 IMPLANT

## 2016-04-02 NOTE — Discharge Instructions (Signed)
Myomectomy, Care After Refer to this sheet in the next few weeks. These instructions provide you with information on caring for yourself after your procedure. Your health care provider may also give you more specific instructions. Your treatment has been planned according to current medical practices, but problems sometimes occur. Call your health care provider if you have any problems or questions after your procedure. WHAT TO EXPECT AFTER THE PROCEDURE After your procedure, it is typical to have the following:  Pain in your abdomen, especially at any incision sites. You will be given pain medicine to control the pain.  Tiredness. This is a normal part of the recovery process. Your energy level will return to normal over the next several weeks.  Constipation.  Vaginal bleeding. This is normal and should stop after 1-2 weeks. HOME CARE INSTRUCTIONS   Only take over-the-counter or prescription medicines as directed by your health care provider. Avoid aspirin because it can cause bleeding.  Do not douche, use tampons, or have sexual intercourse until given permission by your health care provider.  Remove or change any bandages (dressings) as directed by your health care provider.  Take showers instead of baths as directed by your health care provider.  You will probably be able to go back to your normal routine after a few days. Do not do anything that requires extra effort until your health care provider says it is okay. Do not lift anything heavier than 15 pounds (6.8 kg) until your health care provider approves.  Walk daily but take frequent rest breaks if you tire easily.  Continue to practice deep breathing and coughing. If it hurts to cough, try holding a pillow against your belly as you cough.  If you become constipated, you may:  Use a mild laxative if your health care provider approves.  Add more fruit and bran to your diet.  Drink enough fluids to keep your urine clear or  pale yellow.  Take your temperature twice a day and write it down.  Do not drink alcohol.  Do not drive until your health care provider approves.  Have someone help you at home for 1 week or until you can do your own household activities.  Follow up with your health care provider as directed. SEEK MEDICAL CARE IF:  You have a fever.  You have increasing abdominal pain that is not relieved with medicine.  You have nausea, vomiting, or diarrhea.  You have pain when you urinate, or you have blood in your urine.  You have a rash on your body.  You have pain or redness where your IV access tube was inserted.  You have redness, swelling, or any kind of drainage from an incision. SEEK IMMEDIATE MEDICAL CARE IF:   You have weakness or lightheadedness.  You have pain, swelling, or redness in your legs.  You have chest pain.  You faint.  You have shortness of breath.  You have heavy vaginal bleeding.  Your incision is opening up.   This information is not intended to replace advice given to you by your health care provider. Make sure you discuss any questions you have with your health care provider.   Document Released: 11/14/2010 Document Revised: 07/15/2014 Document Reviewed: 02/03/2013 Elsevier Interactive Patient Education 2016 [place]: The bandaids or dressing which are placed over the skin openings may be removed the day after surgery. The incision should be kept clean and dry. The stitches do not  need to be removed. Should the incision become sore, red, and swollen after the first week, check with your doctor.  Personal Hygiene: Shower the day after your procedure. Always wipe from front to back after elimination.   Activity: Do not drive or operate any equipment today. The effects of the anesthesia are still present and drowsiness may result. Rest today, not necessarily flat bed rest, just take it easy. You  may resume your normal activity in one to three days or as instructed by your physician.  Sexual Activity: You resume sexual activity as indicated by your physician_________. If your laparoscopy was for a sterilization ( tubes tied ), continue current method of birth control until after your next period or ask for specific instructions from your doctor.  Diet: Eat a light diet as desired this evening. You may resume a regular diet tomorrow.  Return to Work: Two to three days or as indicated by your doctor.  Expectations After Surgery: Your surgery will cause vaginal drainage or spotting which may continue for 2-3 days. Mild abdominal discomfort or tenderness is not unusual and some shoulder pain may also be noted which can be relieved by lying flat in pain.  Call Your Doctor If these Occur:  Persistent or heavy bleeding at incision site       Redness or swelling around incision       Elevation of temperature greater than 100 degrees F  Call for follow-up appointment _____________.   Post Anesthesia Home Care Instructions  Activity: Get plenty of rest for the remainder of the day. A responsible adult should stay with you for 24 hours following the procedure.  For the next 24 hours, DO NOT: -Drive a car -Paediatric nurse -Drink alcoholic beverages -Take any medication unless instructed by your physician -Make any legal decisions or sign important papers.  Meals: Start with liquid foods such as gelatin or soup. Progress to regular foods as tolerated. Avoid greasy, spicy, heavy foods. If nausea and/or vomiting occur, drink only clear liquids until the nausea and/or vomiting subsides. Call your physician if vomiting continues.  Special Instructions/Symptoms: Your throat may feel dry or sore from the anesthesia or the breathing tube placed in your throat during surgery. If this causes discomfort, gargle with warm salt water. The discomfort should disappear within 24 hours.  If you had  a scopolamine patch placed behind your ear for the management of post- operative nausea and/or vomiting:  1. The medication in the patch is effective for 72 hours, after which it should be removed.  Wrap patch in a tissue and discard in the trash. Wash hands thoroughly with soap and water. 2. You may remove the patch earlier than 72 hours if you experience unpleasant side effects which may include dry mouth, dizziness or visual disturbances. 3. Avoid touching the patch. Wash your hands with soap and water after contact with the patch.   Because you received Toradol in the OR today, no ibuprofen, motrin, advil, aleve until 10 pm.  Percocet given at 7 pm , next dose due @ 1am.

## 2016-04-02 NOTE — Transfer of Care (Signed)
Immediate Anesthesia Transfer of Care Note  Patient: Donna Kelley  Procedure(s) Performed: Procedure(s) (LRB): LAPAROSCOPIC GELPORT ASSISTED MYOMECTOMY (N/A)  Patient Location: PACU  Anesthesia Type: General  Level of Consciousness: awake, sedated, patient cooperative and responds to stimulation  Airway & Oxygen Therapy: Patient Spontanous Breathing and Patient connected to face mask oxygen  Post-op Assessment: Report given to PACU RN, Post -op Vital signs reviewed and stable and Patient moving all extremities  Post vital signs: Reviewed and stable  Complications: No apparent anesthesia complications

## 2016-04-02 NOTE — Anesthesia Postprocedure Evaluation (Signed)
Anesthesia Post Note  Patient: SILPA BARHAM  Procedure(s) Performed: Procedure(s) (LRB): LAPAROSCOPIC GELPORT ASSISTED MYOMECTOMY (N/A)  Patient location during evaluation: PACU Anesthesia Type: General Level of consciousness: awake and alert Pain management: pain level controlled Vital Signs Assessment: post-procedure vital signs reviewed and stable Respiratory status: spontaneous breathing, nonlabored ventilation and respiratory function stable Cardiovascular status: blood pressure returned to baseline and stable Postop Assessment: no signs of nausea or vomiting Anesthetic complications: no    Last Vitals:  Vitals:   04/02/16 1715 04/02/16 1730  BP: (!) 134/56 129/61  Pulse: 81 73  Resp: 12 (!) 24  Temp:      Last Pain:  Vitals:   04/02/16 1745  TempSrc:   PainSc: (P) 7                  Nilda Simmer

## 2016-04-02 NOTE — Anesthesia Preprocedure Evaluation (Signed)
Anesthesia Evaluation  Patient identified by MRN, date of birth, ID band Patient awake    Reviewed: Allergy & Precautions, H&P , NPO status , Patient's Chart, lab work & pertinent test results  History of Anesthesia Complications Negative for: history of anesthetic complications  Airway Mallampati: II  TM Distance: >3 FB Neck ROM: full    Dental no notable dental hx.    Pulmonary neg pulmonary ROS,    Pulmonary exam normal breath sounds clear to auscultation       Cardiovascular hypertension, Pt. on medications Normal cardiovascular exam Rhythm:regular Rate:Normal     Neuro/Psych  Headaches,    GI/Hepatic negative GI ROS, Neg liver ROS,   Endo/Other  Morbid obesity  Renal/GU negative Renal ROS     Musculoskeletal   Abdominal   Peds  Hematology negative hematology ROS (+)   Anesthesia Other Findings   Reproductive/Obstetrics negative OB ROS                             Anesthesia Physical Anesthesia Plan  ASA: III  Anesthesia Plan: General   Post-op Pain Management:    Induction: Intravenous  Airway Management Planned: Oral ETT  Additional Equipment:   Intra-op Plan:   Post-operative Plan: Extubation in OR  Informed Consent: I have reviewed the patients History and Physical, chart, labs and discussed the procedure including the risks, benefits and alternatives for the proposed anesthesia with the patient or authorized representative who has indicated his/her understanding and acceptance.   Dental Advisory Given  Plan Discussed with: Anesthesiologist, CRNA and Surgeon  Anesthesia Plan Comments:         Anesthesia Quick Evaluation

## 2016-04-02 NOTE — Anesthesia Procedure Notes (Signed)
Procedure Name: Intubation Date/Time: 04/02/2016 2:16 PM Performed by: Justice Rocher Pre-anesthesia Checklist: Patient identified, Emergency Drugs available, Suction available and Patient being monitored Patient Re-evaluated:Patient Re-evaluated prior to inductionOxygen Delivery Method: Circle system utilized Preoxygenation: Pre-oxygenation with 100% oxygen Intubation Type: IV induction Ventilation: Mask ventilation without difficulty Laryngoscope Size: Mac and 4 Grade View: Grade III Tube type: Oral Tube size: 7.0 mm Number of attempts: 1 Airway Equipment and Method: Stylet and Oral airway Placement Confirmation: ETT inserted through vocal cords under direct vision,  positive ETCO2 and breath sounds checked- equal and bilateral Secured at: 23 cm Tube secured with: Tape Dental Injury: Teeth and Oropharynx as per pre-operative assessment

## 2016-04-03 ENCOUNTER — Encounter (HOSPITAL_BASED_OUTPATIENT_CLINIC_OR_DEPARTMENT_OTHER): Payer: Self-pay | Admitting: Obstetrics and Gynecology

## 2016-04-16 ENCOUNTER — Encounter: Payer: Self-pay | Admitting: *Deleted

## 2016-04-16 NOTE — Op Note (Signed)
Operative Note  Preoperative diagnosis: Uterine fibroids, menorrhagia  Postoperative diagnosis: Uterine fibroids, menorrhagia  Procedure: Laparoscopy, GelPort assisted myomectomy  Anesthesia: Gen. 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 The vagina was normal. The cervix was nulliparous and appeared grossly normal. The uterus was 8-9 week size, firm and mobile. It sounded to 10 cm.  On laparoscopy, upper abdomen, liver surface and diaphragm surfaces were normal. Gallbladder was normal. The appendix appeared normal.  The uterus contained 2.2 cm fundal transmural myoma with the lower aspect of it attached to the endometrium, which was entered during the myomectomy. The tubes were status post partial salpingectomy for sterilization. The ovaries appeared normal. The pelvic peritoneum looked mostly normal.    Description of the procedure: The patient was placed in dorsal supine position and general endotracheal anesthesia was given. 2 g of cefazolin were given intravenously for prophylaxis. Patient was placed in lithotomy position. She was prepped and draped in sterile manner.  The bladder was straight catheterized . A ZUMI catheter was placed into the uterine cavity. This was connected to a syringe containing diluted methylene blue solution which was used to define the endometrium during the myomectomy. The uterus sounded to 10 cm.  The surgeon was regloved and a surgical field was created on the abdomen.  After preemptive anesthesia of all surgical sites with 0.5% bupivacaine, a 5 mm intraumbilical skin incision was made and a Verress needle was inserted. Its correct location was confirmed. A pneumoperitoneum was created with carbon dioxide.  5 mm laparoscope with a 30 lens was inserted and video laparoscopy  was started . A left lower quadrant 5 mm and a right lower quadrant  5 mm incisions were made and ancillary trochars were placed under direct visualization. Above findings were noted.  A dilute solution of vasopressin (0.4 units per mL) was injected into the myometrium overlying the fundal myoma, until the myometrium blanched. A needle electrode with 16 W of cutting current  was used to make a transverse incision on the myometrium overlying the fundal 3.4 cm myoma. The myoma was grasped with tenaculum and dissection was started.  We then made a 3 cm transverse suprapubic incision to insert a GelPort. After dissection of the anatomic layers, the peritoneal cavity was entered. A GelPort was placed and the rest of the case was performed either using this port as a laparoscopic port or as a minilaparotomy port.  The rest of the myoma was taken out of this 3 cm incision. The endometrial cavity was entered at the lower aspect of the 3.4 cm myoma. This was detected by the egress of methylene blue solution injected transcervically.  The myoma defect was closed in 4 layers: The first layer was an endometrial suture of 4-0 Vicryl deep myometrial suture of 2-0 Vicryl continuous interlocking suture, the second and third layers were deep and superficial myometrial layers of 2-0 Vicryl continuous suture. A 4-0 Vicryl continuous suture was placed on the serosa and the most superficial myometrium.  The suprapubic fascial incision was closed with 2-0 Vicryl continuous suture. Subcutaneous tissue was irrigated and aspirated good hemostasis was achieved. The abdomen and the pelvis was carefully inspected under laparoscopic visualization and the pelvis was copiously irrigated and aspirated. A slurry of 2 sheets of Seprafilm in 60 mL of normal saline was injected as an adhesion barrier into the pelvis. The  gas was allowed to escape. The instrument and the lap pad count were correct. The trochars were removed. The skin incisions  were approximated with 4-0 Monocryl in subcuticular sutures, including the 3 cm skin incision that belonged to the [place] site.  The patient tolerated the procedure well and was transferred to recovery room in satisfactory condition.  SPECIAL NOTE: Because of the extent of the myometrial incision during the uterine myomectomy, it is recommended that this patient deliver by a cesarean section with her future pregnancies.  Governor Specking, MD

## 2016-06-07 DIAGNOSIS — Z1159 Encounter for screening for other viral diseases: Secondary | ICD-10-CM | POA: Diagnosis not present

## 2016-06-07 DIAGNOSIS — Z01419 Encounter for gynecological examination (general) (routine) without abnormal findings: Secondary | ICD-10-CM | POA: Diagnosis not present

## 2016-06-07 DIAGNOSIS — Z6841 Body Mass Index (BMI) 40.0 and over, adult: Secondary | ICD-10-CM | POA: Diagnosis not present

## 2016-06-07 DIAGNOSIS — Z114 Encounter for screening for human immunodeficiency virus [HIV]: Secondary | ICD-10-CM | POA: Diagnosis not present

## 2016-06-07 DIAGNOSIS — Z113 Encounter for screening for infections with a predominantly sexual mode of transmission: Secondary | ICD-10-CM | POA: Diagnosis not present

## 2016-06-07 DIAGNOSIS — Z118 Encounter for screening for other infectious and parasitic diseases: Secondary | ICD-10-CM | POA: Diagnosis not present

## 2016-07-05 ENCOUNTER — Encounter: Payer: Self-pay | Admitting: *Deleted

## 2016-07-10 ENCOUNTER — Other Ambulatory Visit: Payer: Self-pay | Admitting: Certified Nurse Midwife

## 2016-07-10 DIAGNOSIS — L308 Other specified dermatitis: Secondary | ICD-10-CM

## 2016-07-10 MED ORDER — TRIAMCINOLONE ACETONIDE 0.5 % EX OINT: 1.0000 "application " | TOPICAL_OINTMENT | Freq: Two times a day (BID) | CUTANEOUS | 99 refills | Status: DC

## 2016-08-13 ENCOUNTER — Other Ambulatory Visit: Payer: Self-pay | Admitting: Certified Nurse Midwife

## 2016-08-13 DIAGNOSIS — J45998 Other asthma: Secondary | ICD-10-CM

## 2016-08-13 MED ORDER — ALBUTEROL SULFATE HFA 108 (90 BASE) MCG/ACT IN AERS: 2.0000 | INHALATION_SPRAY | Freq: Four times a day (QID) | RESPIRATORY_TRACT | 1 refills | Status: DC | PRN

## 2016-11-15 DIAGNOSIS — I10 Essential (primary) hypertension: Secondary | ICD-10-CM | POA: Diagnosis not present

## 2016-11-15 DIAGNOSIS — R635 Abnormal weight gain: Secondary | ICD-10-CM | POA: Diagnosis not present

## 2017-01-13 DIAGNOSIS — N971 Female infertility of tubal origin: Secondary | ICD-10-CM | POA: Diagnosis not present

## 2017-03-04 ENCOUNTER — Telehealth: Payer: 59 | Admitting: Family

## 2017-03-04 DIAGNOSIS — M546 Pain in thoracic spine: Secondary | ICD-10-CM

## 2017-03-04 MED ORDER — NAPROXEN 500 MG PO TABS: 500.0000 mg | ORAL_TABLET | Freq: Two times a day (BID) | ORAL | 0 refills | Status: DC

## 2017-03-04 MED ORDER — BACLOFEN 10 MG PO TABS: 10.0000 mg | ORAL_TABLET | Freq: Three times a day (TID) | ORAL | 0 refills | Status: DC

## 2017-03-04 NOTE — Progress Notes (Signed)

## 2017-03-18 ENCOUNTER — Encounter (INDEPENDENT_AMBULATORY_CARE_PROVIDER_SITE_OTHER): Payer: Self-pay

## 2017-05-09 ENCOUNTER — Ambulatory Visit (INDEPENDENT_AMBULATORY_CARE_PROVIDER_SITE_OTHER): Payer: 59 | Admitting: Family Medicine

## 2017-05-09 ENCOUNTER — Encounter: Payer: Self-pay | Admitting: Family Medicine

## 2017-05-09 VITALS — BP 120/88 | HR 91 | Ht 66.0 in | Wt 306.0 lb

## 2017-05-09 DIAGNOSIS — R1030 Lower abdominal pain, unspecified: Secondary | ICD-10-CM | POA: Diagnosis not present

## 2017-05-09 DIAGNOSIS — I10 Essential (primary) hypertension: Secondary | ICD-10-CM | POA: Diagnosis not present

## 2017-05-09 LAB — POCT URINALYSIS DIPSTICK
Bilirubin, UA: NEGATIVE
Blood, UA: NEGATIVE
Glucose, UA: NEGATIVE
KETONES UA: 5
LEUKOCYTES UA: NEGATIVE
NITRITE UA: NEGATIVE
PROTEIN UA: 15
Spec Grav, UA: >=1.03 — AB (ref 1.010–1.025)
Urobilinogen, UA: 1 E.U./dL
pH, UA: 6 (ref 5.0–8.0)

## 2017-05-09 NOTE — Progress Notes (Signed)
Subjective:  Donna Kelley is a 36 y.o. female who presents today with a chief complaint of HTN and to establish care.Marland Kitchen   HPI:  Hypertension, Chronic Problem, Stable BP Readings from Last 3 Encounters:  05/09/17 120/88  04/02/16 (!) 151/80  01/02/16 (!) 162/108   Home BP monitoring: Yes, Ranges: 120s/80s Current Medications: HCTZ 25 mg daily, compliant without side effects. Additional history: Long-standing history.  Typically well controlled on HCTZ.  ROS: Denies any chest pain, shortness of breath, dyspnea on exertion, leg edema.   Abdominal Pain, new problem Symptoms started about a year ago after she had a myomectomy for uterine fibroids.  Initially the pain was only associated with her menstrual cycle over the past few months it has worsened and become more consistent.  Pain is located in her bilateral lower abdomen.  No notable aggravating or alleviating factors.  No obvious precipitating events aside from her myomectomy patient has a history of tubal ligation. No dysuria.  No hematuria.  No vaginal discharge.  No  abnormal vaginal bleeding.  No nausea or vomiting.  No diarrhea.  She has mild constipation that she attributes to her iron supplement.  Pain sometimes improves with Excedrin that she takes for her migraines.  ROS: Per HPI, otherwise a 14 point review of systems was performed and was negative  PMH:  The following were reviewed and entered/updated in epic: Past Medical History:  Diagnosis Date  . Anemia   . Emphysema of lung ([place])   . Frequent urinary tract infections   . Hypertension   . Migraines   . Uterine fibroid    Patient Active Problem List   Diagnosis Date Noted  . Essential hypertension 05/09/2017   Past Surgical History:  Procedure Laterality Date  . LAPAROSCOPIC GELPORT ASSISTED MYOMECTOMY N/A 04/02/2016   Procedure: LAPAROSCOPIC GELPORT ASSISTED MYOMECTOMY;  Surgeon: Governor Specking, MD;  Location: [facility];  Service:  Gynecology;  Laterality: N/A;  . NOSE SURGERY  1996  . TUBAL LIGATION Bilateral 05/14/2001   PPTL  . WISDOM TOOTH EXTRACTION  09/2015    Family History  Problem Relation Age of Onset  . Asthma Mother   . Cancer Mother        Pancreatic   . Hypertension Mother   . Cancer Father   . Cancer Maternal Aunt        Breast    Medications- reviewed and updated Current Outpatient Prescriptions  Medication Sig Dispense Refill  . aspirin-acetaminophen-caffeine (EXCEDRIN MIGRAINE) 250-250-65 MG tablet Take by mouth every 6 (six) hours as needed for headache.    . hydrochlorothiazide (HYDRODIURIL) 25 MG tablet Take 1 tablet (25 mg total) by mouth daily. (Patient taking differently: Take 25 mg by mouth every morning. ) 30 tablet 1  . Multiple Vitamins-Minerals (MULTIVITAMIN WITH MINERALS) tablet Take 1 tablet by mouth daily.    . SUMAtriptan (IMITREX) 100 MG tablet Take 100 mg by mouth every 2 (two) hours as needed for migraine. May repeat in 2 hours if headache persists or recurs.    . triamcinolone ointment (KENALOG) 0.5 % Apply 1 application topically 2 (two) times daily. 90 g PRN  . albuterol (PROVENTIL HFA;VENTOLIN HFA) 108 (90 Base) MCG/ACT inhaler Inhale 2 puffs into the lungs every 6 (six) hours as needed for wheezing or shortness of breath. (Patient not taking: Reported on 05/09/2017) 18 g 1   No current facility-administered medications for this visit.     Allergies-reviewed and updated Allergies  Allergen Reactions  .  Shellfish Allergy Anaphylaxis, Hives and Swelling  . Erythromycin Rash    Social History   Social History  . Marital status: Married    Spouse name: N/A  . Number of children: N/A  . Years of education: N/A   Social History Main Topics  . Smoking status: Never Smoker  . Smokeless tobacco: Never Used  . Alcohol use Yes     Comment: Occasionally   . Drug use: No  . Sexual activity: Yes    Birth control/ protection: Surgical   Other Topics Concern  . None    Social History Narrative  . None   Objective:  Physical Exam: BP 120/88   Pulse 91   Ht 5\' 6"  (1.676 m)   Wt (!) 306 lb (138.8 kg)   LMP 04/25/2017 Comment: First Day   BMI 49.39 kg/m   Gen: NAD, resting comfortably CV: RRR with no murmurs appreciated Pulm: NWOB, CTAB with no crackles, wheezes, or rhonchi GI: Morbidly obese, normal bowel sounds present. Soft, Nontender, Nondistended. GU: Deferred MSK: No edema, cyanosis, or clubbing noted Skin: Warm, dry Neuro: Grossly normal, moves all extremities Psych: Normal affect and thought content  Results for orders placed or performed in visit on 05/09/17 (from the past 72 hour(s))  POCT urinalysis dipstick     Status: Abnormal   Collection Time: 05/09/17  2:55 PM  Result Value Ref Range   Color, UA Yellow    Clarity, UA Clear    Glucose, UA Negative    Bilirubin, UA Negative    Ketones, UA 5.0    Spec Grav, UA >=1.030 (A) 1.010 - 1.025   Blood, UA Negative    pH, UA 6.0 5.0 - 8.0   Protein, UA 15    Urobilinogen, UA 1.0 0.2 or 1.0 E.U./dL   Nitrite, UA Negative    Leukocytes, UA Negative Negative   Assessment/Plan:  Essential hypertension At goal on HCTZ.  We will continue.  Abdominal pain Broad differential.  UA normal.  Abdominal exam benign though limited secondary to patient's body habitus.  No red flag signs or symptoms.  We will obtain pelvic ultrasound to rule out anatomic abnormalities.  May need referral to OB/GYN pending results.  Continue ibuprofen as needed.  Preventative healthcare Up-to-date on flu shot.  Obtain prior records from her previous PCP for other preventative health care.  Algis Greenhouse. Jerline Pain, MD 05/09/2017 3:34 PM

## 2017-05-09 NOTE — Patient Instructions (Signed)
Your urine sample is normal.  We will not make any changes today for your medications.  We will set you up to have an ultrasound to look at your uterus and ovaries. We will call you with the next steps after we get the results. In the meantime, you can use 600mg  of ibuprofen as needed for the pain.  Please come back soon for a comprehensive physical and blood work.  Take care,  Dr Jerline Pain

## 2017-05-09 NOTE — Assessment & Plan Note (Signed)
At goal on HCTZ.  We will continue.

## 2017-05-13 ENCOUNTER — Other Ambulatory Visit: Payer: Self-pay | Admitting: Certified Registered Nurse Anesthetist

## 2017-05-26 ENCOUNTER — Telehealth: Payer: Self-pay | Admitting: Family Medicine

## 2017-05-26 MED ORDER — HYDROCHLOROTHIAZIDE 25 MG PO TABS: 25.0000 mg | ORAL_TABLET | Freq: Every day | ORAL | 1 refills | Status: DC

## 2017-05-26 MED ORDER — SUMATRIPTAN SUCCINATE 100 MG PO TABS: 100.0000 mg | ORAL_TABLET | ORAL | 2 refills | Status: AC | PRN

## 2017-05-26 NOTE — Telephone Encounter (Signed)
Medication sent to pharmacy  

## 2017-05-26 NOTE — Telephone Encounter (Signed)
MEDICATION:   hydrochlorothiazide (HYDRODIURIL) 25 MG tablet  SUMAtriptan (IMITREX) 100 MG tablet   PHARMACY:   [facility] [place] [phone] (Phone) [phone] (Fax)     IS THIS A 90 DAY SUPPLY : no; normal supply  IS PATIENT OUT OF MEDICATION: yes both  IF NOT; HOW MUCH IS LEFT: n/a  LAST APPOINTMENT DATE: @11 /08/2016  NEXT APPOINTMENT DATE:@Visit  date not found  OTHER COMMENTS:    **Let patient know to contact pharmacy at the end of the day to make sure medication is ready. **  ** Please notify patient to allow 48-72 hours to process**  **Encourage patient to contact the pharmacy for refills or they can request refills through [facility]**

## 2017-06-26 ENCOUNTER — Encounter (INDEPENDENT_AMBULATORY_CARE_PROVIDER_SITE_OTHER): Payer: Self-pay

## 2017-08-01 ENCOUNTER — Ambulatory Visit: Payer: 59 | Admitting: Adult Health

## 2017-08-01 DIAGNOSIS — Z0289 Encounter for other administrative examinations: Secondary | ICD-10-CM

## 2017-08-08 DIAGNOSIS — Z68.41 Body mass index (BMI) pediatric, less than 5th percentile for age: Secondary | ICD-10-CM | POA: Diagnosis not present

## 2017-08-08 DIAGNOSIS — R1084 Generalized abdominal pain: Secondary | ICD-10-CM | POA: Diagnosis not present

## 2017-08-08 DIAGNOSIS — Z01419 Encounter for gynecological examination (general) (routine) without abnormal findings: Secondary | ICD-10-CM | POA: Diagnosis not present

## 2017-08-08 DIAGNOSIS — Z118 Encounter for screening for other infectious and parasitic diseases: Secondary | ICD-10-CM | POA: Diagnosis not present

## 2017-08-08 DIAGNOSIS — N760 Acute vaginitis: Secondary | ICD-10-CM | POA: Diagnosis not present

## 2017-10-06 ENCOUNTER — Other Ambulatory Visit: Payer: Self-pay

## 2017-10-06 DIAGNOSIS — R1030 Lower abdominal pain, unspecified: Secondary | ICD-10-CM

## 2017-11-03 ENCOUNTER — Other Ambulatory Visit: Payer: Self-pay | Admitting: Certified Nurse Midwife

## 2017-11-03 DIAGNOSIS — J45998 Other asthma: Secondary | ICD-10-CM

## 2017-11-28 ENCOUNTER — Other Ambulatory Visit: Payer: Self-pay | Admitting: Family Medicine

## 2018-01-06 ENCOUNTER — Ambulatory Visit (INDEPENDENT_AMBULATORY_CARE_PROVIDER_SITE_OTHER): Payer: Self-pay | Admitting: Family Medicine

## 2018-01-06 VITALS — BP 125/85 | HR 71 | Temp 97.9°F | Resp 16 | Wt 312.4 lb

## 2018-01-06 DIAGNOSIS — Z0000 Encounter for general adult medical examination without abnormal findings: Secondary | ICD-10-CM

## 2018-01-06 NOTE — Patient Instructions (Signed)

## 2018-01-06 NOTE — Progress Notes (Signed)
Donna Kelley is a 37 y.o. female who presents today with concerns of need for an annual physical exam.She reports chronic health conditions of HTN and is under the care of her PCP and reports the condition is well managed. She is here requesting a physical for school purposes.  Review of Systems  Constitutional: Negative for chills, fever and malaise/fatigue.  HENT: Negative for congestion, ear discharge, ear pain, sinus pain and sore throat.   Eyes: Negative.   Respiratory: Negative for cough, sputum production and shortness of breath.   Cardiovascular: Negative.  Negative for chest pain.  Gastrointestinal: Negative for abdominal pain, diarrhea, nausea and vomiting.  Genitourinary: Negative for dysuria, frequency, hematuria and urgency.  Musculoskeletal: Negative for myalgias.  Skin: Negative.   Neurological: Negative for headaches.  Endo/Heme/Allergies: Negative.   Psychiatric/Behavioral: Negative.     O: Vitals:   01/06/18 1746  BP: 125/85  Pulse: 71  Resp: 16  Temp: 97.9 F (36.6 C)  SpO2: 94%     Physical Exam  Constitutional: She is oriented to person, place, and time. Vital signs are normal. She appears well-developed and well-nourished. She is active.  Non-toxic appearance. She does not have a sickly appearance.  HENT:  Head: Normocephalic.  Right Ear: Hearing, tympanic membrane, external ear and ear canal normal.  Left Ear: Hearing, tympanic membrane, external ear and ear canal normal.  Nose: Nose normal.  Mouth/Throat: Uvula is midline and oropharynx is clear and moist.  Neck: Normal range of motion. Neck supple.  Cardiovascular: Normal rate, regular rhythm, normal heart sounds and normal pulses.  Pulmonary/Chest: Effort normal and breath sounds normal.  Abdominal: Soft. Bowel sounds are normal.  Musculoskeletal: Normal range of motion.  Lymphadenopathy:       Head (right side): No submental and no submandibular adenopathy present.       Head (left  side): No submental and no submandibular adenopathy present.    She has no cervical adenopathy.  Neurological: She is alert and oriented to person, place, and time.  Psychiatric: She has a normal mood and affect.  Vitals reviewed.    A: 1. Physical exam      P: Exam findings, diagnosis etiology and medication use and indications reviewed with patient. Follow- Up and discharge instructions provided. No emergent/urgent issues found on exam.  Patient verbalized understanding of information provided and agrees with plan of care (POC), all questions answered.  1. Physical exam WNL- forms filled out and returned.

## 2018-01-07 ENCOUNTER — Other Ambulatory Visit: Payer: Self-pay | Admitting: Obstetrics

## 2018-01-07 ENCOUNTER — Other Ambulatory Visit: Payer: 59

## 2018-01-07 DIAGNOSIS — Z111 Encounter for screening for respiratory tuberculosis: Secondary | ICD-10-CM

## 2018-01-13 LAB — QUANTIFERON-TB GOLD PLUS
QUANTIFERON NIL VALUE: 0.04 [IU]/mL
QUANTIFERON TB1 AG VALUE: 0.08 [IU]/mL
QUANTIFERON TB2 AG VALUE: 0.06 [IU]/mL
QuantiFERON Mitogen Value: >10 IU/mL
QuantiFERON-TB Gold Plus: NEGATIVE

## 2018-02-04 DIAGNOSIS — A5901 Trichomonal vulvovaginitis: Secondary | ICD-10-CM | POA: Diagnosis not present

## 2018-02-04 DIAGNOSIS — Z118 Encounter for screening for other infectious and parasitic diseases: Secondary | ICD-10-CM | POA: Diagnosis not present

## 2018-02-04 DIAGNOSIS — B373 Candidiasis of vulva and vagina: Secondary | ICD-10-CM | POA: Diagnosis not present

## 2018-06-02 ENCOUNTER — Other Ambulatory Visit: Payer: Self-pay | Admitting: Family Medicine

## 2018-07-21 ENCOUNTER — Telehealth: Payer: 59 | Admitting: Family

## 2018-07-21 ENCOUNTER — Telehealth: Payer: 59 | Admitting: Nurse Practitioner

## 2018-07-21 DIAGNOSIS — J45998 Other asthma: Secondary | ICD-10-CM

## 2018-07-21 DIAGNOSIS — B9689 Other specified bacterial agents as the cause of diseases classified elsewhere: Secondary | ICD-10-CM | POA: Diagnosis not present

## 2018-07-21 DIAGNOSIS — B373 Candidiasis of vulva and vagina: Secondary | ICD-10-CM

## 2018-07-21 DIAGNOSIS — B3731 Acute candidiasis of vulva and vagina: Secondary | ICD-10-CM

## 2018-07-21 DIAGNOSIS — J208 Acute bronchitis due to other specified organisms: Secondary | ICD-10-CM

## 2018-07-21 MED ORDER — BENZONATATE 100 MG PO CAPS: 100.0000 mg | ORAL_CAPSULE | Freq: Three times a day (TID) | ORAL | 0 refills | Status: DC | PRN

## 2018-07-21 MED ORDER — FLUCONAZOLE 150 MG PO TABS: 150.0000 mg | ORAL_TABLET | Freq: Once | ORAL | 0 refills | Status: AC

## 2018-07-21 MED ORDER — ALBUTEROL SULFATE 108 (90 BASE) MCG/ACT IN AEPB: 2.0000 | INHALATION_SPRAY | Freq: Four times a day (QID) | RESPIRATORY_TRACT | 2 refills | Status: DC | PRN

## 2018-07-21 MED ORDER — DOXYCYCLINE HYCLATE 100 MG PO TABS: 100.0000 mg | ORAL_TABLET | Freq: Two times a day (BID) | ORAL | 0 refills | Status: DC

## 2018-07-21 MED ORDER — PREDNISONE 5 MG PO TABS
5.0000 mg | ORAL_TABLET | ORAL | 0 refills | Status: DC
Start: 2018-07-21 — End: 2019-06-14

## 2018-07-21 NOTE — Progress Notes (Signed)

## 2018-07-21 NOTE — Progress Notes (Signed)
Thank you for the details you included in the comment boxes. Those details are very helpful in determining the best course of treatment for you and help [place] to provide the best care.  As far as the yeast infection, as this is a totally different part of the body, we require another e-visit to be completed. This is for your safety and is a [facility] policy for that reason. That way, the chart has the documentation to support the care plan. Please complete the Vaginal Discharge e-visit template and I will treat you for that immediately.  For the refill, we typically do not refill medications. Occasionally, we will do this as a courtesy. However, Dimas Chyle, your provider, specifically put a note that requires a face-to-face follow-up for refills of any kind.  We are sorry that you are not feeling well.  Here is how we plan to help!  Based on your presentation I believe you most likely have A cough due to bacteria.  When patients have a fever and a productive cough with a change in color or increased sputum production, we are concerned about bacterial bronchitis.  If left untreated it can progress to pneumonia.  If your symptoms do not improve with your treatment plan it is important that you contact your provider.   I have prescribed Doxycycline 100 mg twice a day for 7 days     In addition you may use A non-prescription cough medication called Mucinex DM: take 2 tablets every 12 hours. and A prescription cough medication called Tessalon Perles 100mg . You may take 1-2 capsules every 8 hours as needed for your cough.  I have also added an Albuterol inhaler, take 2 puffs every 6 hours as needed for shortness of breath.   Prednisone 5 mg daily for 6 days (see taper instructions below)  Directions for 6 day taper: Day 1: 2 tablets before breakfast, 1 after both lunch & dinner and 2 at bedtime Day 2: 1 tab before breakfast, 1 after both lunch & dinner and 2 at bedtime Day 3: 1 tab at each meal & 1 at  bedtime Day 4: 1 tab at breakfast, 1 at lunch, 1 at bedtime Day 5: 1 tab at breakfast & 1 tab at bedtime Day 6: 1 tab at breakfast   From your responses in the eVisit questionnaire you describe inflammation in the upper respiratory tract which is causing a significant cough.  This is commonly called Bronchitis and has four common causes:    Allergies  Viral Infections  Acid Reflux  Bacterial Infection Allergies, viruses and acid reflux are treated by controlling symptoms or eliminating the cause. An example might be a cough caused by taking certain blood pressure medications. You stop the cough by changing the medication. Another example might be a cough caused by acid reflux. Controlling the reflux helps control the cough.  USE OF BRONCHODILATOR ("RESCUE") INHALERS: There is a risk from using your bronchodilator too frequently.  The risk is that over-reliance on a medication which only relaxes the muscles surrounding the breathing tubes can reduce the effectiveness of medications prescribed to reduce swelling and congestion of the tubes themselves.  Although you feel brief relief from the bronchodilator inhaler, your asthma may actually be worsening with the tubes becoming more swollen and filled with mucus.  This can delay other crucial treatments, such as oral steroid medications. If you need to use a bronchodilator inhaler daily, several times per day, you should discuss this with your provider.  There  are probably better treatments that could be used to keep your asthma under control.     HOME CARE . Only take medications as instructed by your medical team. . Complete the entire course of an antibiotic. . Drink plenty of fluids and get plenty of rest. . Avoid close contacts especially the very young and the elderly . Cover your mouth if you cough or cough into your sleeve. . Always remember to wash your hands . A steam or ultrasonic humidifier can help congestion.   GET HELP RIGHT  AWAY IF: . You develop worsening fever. . You become short of breath . You cough up blood. . Your symptoms persist after you have completed your treatment plan MAKE SURE YOU   Understand these instructions.  Will watch your condition.  Will get help right away if you are not doing well or get worse.  Your e-visit answers were reviewed by a board certified advanced clinical practitioner to complete your personal care plan.  Depending on the condition, your plan could have included both over the counter or prescription medications. If there is a problem please reply  once you have received a response from your provider. Your safety is important to [place].  If you have drug allergies check your prescription carefully.    You can use MyChart to ask questions about today's visit, request a non-urgent call back, or ask for a work or school excuse for 24 hours related to this e-Visit. If it has been greater than 24 hours you will need to follow up with your provider, or enter a new e-Visit to address those concerns. You will get an e-mail in the next two days asking about your experience.  I hope that your e-visit has been valuable and will speed your recovery. Thank you for using e-visits.

## 2018-07-24 ENCOUNTER — Other Ambulatory Visit: Payer: Self-pay | Admitting: Family Medicine

## 2018-07-24 DIAGNOSIS — Z1231 Encounter for screening mammogram for malignant neoplasm of breast: Secondary | ICD-10-CM

## 2018-08-07 ENCOUNTER — Other Ambulatory Visit: Payer: Self-pay

## 2018-08-07 MED ORDER — HYDROCHLOROTHIAZIDE 25 MG PO TABS: 25.0000 mg | ORAL_TABLET | Freq: Every day | ORAL | 12 refills | Status: DC

## 2018-08-18 ENCOUNTER — Ambulatory Visit
Admission: RE | Admit: 2018-08-18 | Discharge: 2018-08-18 | Disposition: A | Discharge status: Home / Self Care | Payer: 59 | Source: Ambulatory Visit

## 2018-08-18 DIAGNOSIS — Z1231 Encounter for screening mammogram for malignant neoplasm of breast: Secondary | ICD-10-CM | POA: Diagnosis not present

## 2018-08-19 ENCOUNTER — Other Ambulatory Visit: Payer: Self-pay | Admitting: Family Medicine

## 2018-08-19 DIAGNOSIS — R928 Other abnormal and inconclusive findings on diagnostic imaging of breast: Secondary | ICD-10-CM

## 2018-08-28 ENCOUNTER — Other Ambulatory Visit: Payer: 59

## 2018-09-04 ENCOUNTER — Ambulatory Visit
Admission: RE | Admit: 2018-09-04 | Discharge: 2018-09-04 | Disposition: A | Discharge status: Home / Self Care | Payer: 59 | Source: Ambulatory Visit | Attending: Family Medicine | Admitting: Family Medicine

## 2018-09-04 ENCOUNTER — Other Ambulatory Visit: Payer: Self-pay | Admitting: Family Medicine

## 2018-09-04 DIAGNOSIS — R928 Other abnormal and inconclusive findings on diagnostic imaging of breast: Secondary | ICD-10-CM | POA: Diagnosis not present

## 2018-09-04 DIAGNOSIS — N6489 Other specified disorders of breast: Secondary | ICD-10-CM

## 2019-03-04 ENCOUNTER — Other Ambulatory Visit: Payer: Self-pay

## 2019-03-04 ENCOUNTER — Ambulatory Visit
Admission: RE | Admit: 2019-03-04 | Discharge: 2019-03-04 | Disposition: A | Discharge status: Home / Self Care | Payer: 59 | Source: Ambulatory Visit | Attending: Family Medicine | Admitting: Family Medicine

## 2019-03-04 ENCOUNTER — Ambulatory Visit: Payer: 59

## 2019-03-04 DIAGNOSIS — N6489 Other specified disorders of breast: Secondary | ICD-10-CM

## 2019-03-04 DIAGNOSIS — R928 Other abnormal and inconclusive findings on diagnostic imaging of breast: Secondary | ICD-10-CM | POA: Diagnosis not present

## 2019-03-05 ENCOUNTER — Other Ambulatory Visit: Payer: 59

## 2019-05-05 ENCOUNTER — Telehealth: Payer: 59 | Admitting: Nurse Practitioner

## 2019-05-05 DIAGNOSIS — J029 Acute pharyngitis, unspecified: Secondary | ICD-10-CM | POA: Diagnosis not present

## 2019-05-05 NOTE — Progress Notes (Signed)

## 2019-06-14 ENCOUNTER — Other Ambulatory Visit: Payer: Self-pay

## 2019-06-14 ENCOUNTER — Ambulatory Visit (INDEPENDENT_AMBULATORY_CARE_PROVIDER_SITE_OTHER): Payer: 59 | Admitting: Family Medicine

## 2019-06-14 ENCOUNTER — Encounter: Payer: Self-pay | Admitting: Family Medicine

## 2019-06-14 VITALS — BP 148/82 | HR 85 | Temp 97.6°F | Ht 66.0 in | Wt 317.2 lb

## 2019-06-14 DIAGNOSIS — Z13220 Encounter for screening for lipoid disorders: Secondary | ICD-10-CM

## 2019-06-14 DIAGNOSIS — Z6841 Body Mass Index (BMI) 40.0 and over, adult: Secondary | ICD-10-CM | POA: Diagnosis not present

## 2019-06-14 DIAGNOSIS — M25562 Pain in left knee: Secondary | ICD-10-CM | POA: Diagnosis not present

## 2019-06-14 DIAGNOSIS — M25561 Pain in right knee: Secondary | ICD-10-CM | POA: Diagnosis not present

## 2019-06-14 DIAGNOSIS — I10 Essential (primary) hypertension: Secondary | ICD-10-CM | POA: Diagnosis not present

## 2019-06-14 DIAGNOSIS — Z0001 Encounter for general adult medical examination with abnormal findings: Secondary | ICD-10-CM | POA: Diagnosis not present

## 2019-06-14 LAB — COMPREHENSIVE METABOLIC PANEL
ALT: 11 U/L (ref 0–35)
AST: 18 U/L (ref 0–37)
Albumin: 4 g/dL (ref 3.5–5.2)
Alkaline Phosphatase: 47 U/L (ref 39–117)
BUN: 11 mg/dL (ref 6–23)
CO2: 28 mEq/L (ref 19–32)
Calcium: 9.2 mg/dL (ref 8.4–10.5)
Chloride: 101 mEq/L (ref 96–112)
Creatinine, Ser: 0.7 mg/dL (ref 0.40–1.20)
GFR: 112.48 mL/min (ref >60.00)
Glucose, Bld: 97 mg/dL (ref 70–99)
Potassium: 3.3 mEq/L — ABNORMAL LOW (ref 3.5–5.1)
Sodium: 138 mEq/L (ref 135–145)
Total Bilirubin: 0.3 mg/dL (ref 0.2–1.2)
Total Protein: 7.4 g/dL (ref 6.0–8.3)

## 2019-06-14 LAB — CBC
HCT: 34.5 % — ABNORMAL LOW (ref 36.0–46.0)
Hemoglobin: 11.1 g/dL — ABNORMAL LOW (ref 12.0–15.0)
MCHC: 32 g/dL (ref 30.0–36.0)
MCV: 90.1 fl (ref 78.0–100.0)
Platelets: 366 10*3/uL (ref 150.0–400.0)
RBC: 3.83 Mil/uL — ABNORMAL LOW (ref 3.87–5.11)
RDW: 14 % (ref 11.5–15.5)
WBC: 4.9 10*3/uL (ref 4.0–10.5)

## 2019-06-14 LAB — LIPID PANEL
Cholesterol: 174 mg/dL (ref 0–200)
HDL: 53.3 mg/dL (ref >39.00)
LDL Cholesterol: 112 mg/dL — ABNORMAL HIGH (ref 0–99)
NonHDL: 120.27
Total CHOL/HDL Ratio: 3
Triglycerides: 41 mg/dL (ref 0.0–149.0)
VLDL: 8.2 mg/dL (ref 0.0–40.0)

## 2019-06-14 LAB — TSH: TSH: 1.68 u[IU]/mL (ref 0.35–4.50)

## 2019-06-14 NOTE — Patient Instructions (Addendum)
It was very nice to see you today!  Please try using Voltaren gel for your knees.  Please work on exercises.  We will check blood work today.  Come back in 1 year for your next physical, or sooner if needed.  Take care, Dr Jerline Pain  Please try these tips to maintain a healthy lifestyle:   Eat at least 3 REAL meals and 1-2 snacks per day.  Aim for no more than 5 hours between eating.  If you eat breakfast, please do so within one hour of getting up.    Obtain twice as many fruits/vegetables as protein or carbohydrate foods for both lunch and dinner. (Half of each meal should be fruits/vegetables, one quarter protein, and one quarter starchy carbs)   Cut down on sweet beverages. This includes juice, soda, and sweet tea.    Exercise at least 150 minutes every week.    Preventive Care 44-93 Years Old, Female Preventive care refers to visits with your health care provider and lifestyle choices that can promote health and wellness. This includes:  A yearly physical exam. This may also be called an annual well check.  Regular dental visits and eye exams.  Immunizations.  Screening for certain conditions.  Healthy lifestyle choices, such as eating a healthy diet, getting regular exercise, not using drugs or products that contain nicotine and tobacco, and limiting alcohol use. What can I expect for my preventive care visit? Physical exam Your health care provider will check your:  Height and weight. This may be used to calculate body mass index (BMI), which tells if you are at a healthy weight.  Heart rate and blood pressure.  Skin for abnormal spots. Counseling Your health care provider may ask you questions about your:  Alcohol, tobacco, and drug use.  Emotional well-being.  Home and relationship well-being.  Sexual activity.  Eating habits.  Work and work Statistician.  Method of birth control.  Menstrual cycle.  Pregnancy history. What immunizations do I  need?  Influenza (flu) vaccine  This is recommended every year. Tetanus, diphtheria, and pertussis (Tdap) vaccine  You may need a Td booster every 10 years. Varicella (chickenpox) vaccine  You may need this if you have not been vaccinated. Human papillomavirus (HPV) vaccine  If recommended by your health care provider, you may need three doses over 6 months. Measles, mumps, and rubella (MMR) vaccine  You may need at least one dose of MMR. You may also need a second dose. Meningococcal conjugate (MenACWY) vaccine  One dose is recommended if you are age 72-21 years and a first-year college student living in a residence hall, or if you have one of several medical conditions. You may also need additional booster doses. Pneumococcal conjugate (PCV13) vaccine  You may need this if you have certain conditions and were not previously vaccinated. Pneumococcal polysaccharide (PPSV23) vaccine  You may need one or two doses if you smoke cigarettes or if you have certain conditions. Hepatitis A vaccine  You may need this if you have certain conditions or if you travel or work in places where you may be exposed to hepatitis A. Hepatitis B vaccine  You may need this if you have certain conditions or if you travel or work in places where you may be exposed to hepatitis B. Haemophilus influenzae type b (Hib) vaccine  You may need this if you have certain conditions. You may receive vaccines as individual doses or as more than one vaccine together in one shot (combination vaccines).  Talk with your health care provider about the risks and benefits of combination vaccines. What tests do I need?  Blood tests  Lipid and cholesterol levels. These may be checked every 5 years starting at age 25.  Hepatitis C test.  Hepatitis B test. Screening  Diabetes screening. This is done by checking your blood sugar (glucose) after you have not eaten for a while (fasting).  Sexually transmitted disease  (STD) testing.  BRCA-related cancer screening. This may be done if you have a family history of breast, ovarian, tubal, or peritoneal cancers.  Pelvic exam and Pap test. This may be done every 3 years starting at age 61. Starting at age 47, this may be done every 5 years if you have a Pap test in combination with an HPV test. Talk with your health care provider about your test results, treatment options, and if necessary, the need for more tests. Follow these instructions at home: Eating and drinking   Eat a diet that includes fresh fruits and vegetables, whole grains, lean protein, and low-fat dairy.  Take vitamin and mineral supplements as recommended by your health care provider.  Do not drink alcohol if: ? Your health care provider tells you not to drink. ? You are pregnant, may be pregnant, or are planning to become pregnant.  If you drink alcohol: ? Limit how much you have to 0-1 drink a day. ? Be aware of how much alcohol is in your drink. In the [place] one drink equals one 12 oz bottle of beer (355 mL), one 5 oz glass of wine (148 mL), or one 1 oz glass of hard liquor (44 mL). Lifestyle  Take daily care of your teeth and gums.  Stay active. Exercise for at least 30 minutes on 5 or more days each week.  Do not use any products that contain nicotine or tobacco, such as cigarettes, e-cigarettes, and chewing tobacco. If you need help quitting, ask your health care provider.  If you are sexually active, practice safe sex. Use a condom or other form of birth control (contraception) in order to prevent pregnancy and STIs (sexually transmitted infections). If you plan to become pregnant, see your health care provider for a preconception visit. What's next?  Visit your health care provider once a year for a well check visit.  Ask your health care provider how often you should have your eyes and teeth checked.  Stay up to date on all vaccines. This information is not intended to  replace advice given to you by your health care provider. Make sure you discuss any questions you have with your health care provider. Document Released: 08/20/2001 Document Revised: 03/05/2018 Document Reviewed: 03/05/2018 Elsevier Patient Education  2020 [employer].

## 2019-06-14 NOTE — Assessment & Plan Note (Signed)
Above goal.  Typically well controlled.  Continue 25 mg HCTZ daily.  Continue home monitoring goal 140/90 or lower.

## 2019-06-14 NOTE — Progress Notes (Signed)
Chief Complaint:  Donna Kelley is a 38 y.o. female who presents today for her annual comprehensive physical exam.    Assessment/Plan:  Essential hypertension Above goal.  Typically well controlled.  Continue 25 mg HCTZ daily.  Continue home monitoring goal 140/90 or lower.  Bilateral knee pain Likely patellofemoral syndrome.  May have small component of early OA as well.  Recommended weight loss.  Can use over-the-counter product such as Tylenol, Aleve, and Voltaren gel as needed.  Discussed home exercise program and gave handout for knee strengthening exercises.  If symptoms not improve the next few weeks will need referral to sports medicine.  Body mass index is 51.2 kg/m. / Obese BMI Metric Follow Up - 06/14/19 1037      BMI Metric Follow Up-Please document annually   BMI Metric Follow Up  Education provided       Preventative Healthcare: Gets mammogram Pap smear through gynecology.  Will check CBC CMET TSH and lipid panel.  Patient Counseling(The following topics were reviewed and/or handout was given):  -Nutrition: Stressed importance of moderation in sodium/caffeine intake, saturated fat and cholesterol, caloric balance, sufficient intake of fresh fruits, vegetables, and fiber.  -Stressed the importance of regular exercise.   -Substance Abuse: Discussed cessation/primary prevention of tobacco, alcohol, or other drug use; driving or other dangerous activities under the influence; availability of treatment for abuse.   -Injury prevention: Discussed safety belts, safety helmets, smoke detector, smoking near bedding or upholstery.   -Sexuality: Discussed sexually transmitted diseases, partner selection, use of condoms, avoidance of unintended pregnancy and contraceptive alternatives.   -Dental health: Discussed importance of regular tooth brushing, flossing, and dental visits.  -Health maintenance and immunizations reviewed. Please refer to Health maintenance section.   Return to care in 1 year for next preventative visit.     Subjective:  HPI:  She has no acute complaints today.   She has been having bilateral knee pain for several months.  Is worsened within the last few months.  Worse when going up and down stairs.  Described as a stabbing and shooting pain just behind her kneecap.  Also worse with standing from a sitting position.  Occasional clicking.  No buckling.  No swelling.  Has tried Aleve with modest improvement.  No other treatments tried.  No other obvious aggravating or alleviating factors.  Her stable, chronic medical conditions are outlined below:   # Essential Hypertension - On HCTZ 25mg  daily and tolerating well - Home BPs: 130s/80s - ROS: no reported chest pain or shortness of breath.   Lifestyle Diet: No specific diets or eating plans.  Exercise: No specific exercises.   Depression screen PHQ 2/9 06/14/2019  Decreased Interest 0  Down, Depressed, Hopeless 0  PHQ - 2 Score 0   Health Maintenance Due  Topic Date Due  . HIV Screening  01/18/1995     ROS: Per HPI, otherwise a complete review of systems was negative.   PMH:  The following were reviewed and entered/updated in epic: Past Medical History:  Diagnosis Date  . Anemia   . Emphysema of lung ([place])   . Frequent urinary tract infections   . Hypertension   . Migraines   . Uterine fibroid    Patient Active Problem List   Diagnosis Date Noted  . Essential hypertension 05/09/2017   Past Surgical History:  Procedure Laterality Date  . LAPAROSCOPIC GELPORT ASSISTED MYOMECTOMY N/A 04/02/2016   Procedure: LAPAROSCOPIC GELPORT ASSISTED MYOMECTOMY;  Surgeon: Governor Specking, MD;  Location: [facility];  Service: Gynecology;  Laterality: N/A;  . NOSE SURGERY  1996  . TUBAL LIGATION Bilateral 05/14/2001   PPTL  . WISDOM TOOTH EXTRACTION  09/2015    Family History  Problem Relation Age of Onset  . Asthma Mother   . Cancer Mother        Pancreatic    . Hypertension Mother   . Cancer Father   . Cancer Maternal Aunt        Breast  . Breast cancer Maternal Aunt     Medications- reviewed and updated Current Outpatient Medications  Medication Sig Dispense Refill  . Albuterol Sulfate (PROAIR RESPICLICK) 123XX123 (90 Base) MCG/ACT AEPB Inhale 2 puffs into the lungs every 6 (six) hours as needed (shortness of breath). 1 each 2  . aspirin-acetaminophen-caffeine (EXCEDRIN MIGRAINE) 250-250-65 MG tablet Take by mouth every 6 (six) hours as needed for headache.    . hydrochlorothiazide (HYDRODIURIL) 25 MG tablet Take 1 tablet (25 mg total) by mouth daily. 30 tablet 12  . Multiple Vitamins-Minerals (MULTIVITAMIN WITH MINERALS) tablet Take 1 tablet by mouth daily.    . SUMAtriptan (IMITREX) 100 MG tablet Take 1 tablet (100 mg total) every 2 (two) hours as needed by mouth for migraine. May repeat in 2 hours if headache persists or recurs. (Patient not taking: Reported on 06/14/2019) 10 tablet 2   No current facility-administered medications for this visit.     Allergies-reviewed and updated Allergies  Allergen Reactions  . Shellfish Allergy Anaphylaxis, Hives and Swelling  . Erythromycin Rash    Social History   Socioeconomic History  . Marital status: Married    Spouse name: Not on file  . Number of children: Not on file  . Years of education: Not on file  . Highest education level: Not on file  Occupational History  . Not on file  Social Needs  . Financial resource strain: Not on file  . Food insecurity    Worry: Not on file    Inability: Not on file  . Transportation needs    Medical: Not on file    Non-medical: Not on file  Tobacco Use  . Smoking status: Never Smoker  . Smokeless tobacco: Never Used  Substance and Sexual Activity  . Alcohol use: Yes    Comment: Occasionally   . Drug use: No  . Sexual activity: Yes    Birth control/protection: Surgical  Lifestyle  . Physical activity    Days per week: Not on file     Minutes per session: Not on file  . Stress: Not on file  Relationships  . Social Herbalist on phone: Not on file    Gets together: Not on file    Attends religious service: Not on file    Active member of club or organization: Not on file    Attends meetings of clubs or organizations: Not on file    Relationship status: Not on file  Other Topics Concern  . Not on file  Social History Narrative  . Not on file        Objective:  Physical Exam: BP (!) 148/82   Pulse 85   Temp 97.6 F (36.4 C)   Ht 5\' 6"  (1.676 m)   Wt (!) 317 lb 3.2 oz (143.9 kg)   LMP 06/14/2019   SpO2 95%   BMI 51.20 kg/m   Body mass index is 51.2 kg/m. Wt Readings from Last 3 Encounters:  06/14/19 Marland Kitchen)  317 lb 3.2 oz (143.9 kg)  01/06/18 (!) 312 lb 6.4 oz (141.7 kg)  05/09/17 (!) 306 lb (138.8 kg)   Gen: NAD, resting comfortably HEENT: TMs normal bilaterally. OP clear. No thyromegaly noted.  CV: RRR with no murmurs appreciated Pulm: NWOB, CTAB with no crackles, wheezes, or rhonchi GI: Normal bowel sounds present. Soft, Nontender, Nondistended. MSK: Bilateral knees with no deformities.  Crepitus noted with active range of motion in bilateral knees.  Stable to varus and valgus stress.  Tender to palpation along bilateral joint lines. Skin: warm, dry Neuro: CN2-12 grossly intact. Strength 5/5 in upper and lower extremities. Reflexes symmetric and intact bilaterally.  Psych: Normal affect and thought content     Caleb M. Jerline Pain, MD 06/14/2019 10:40 AM

## 2019-06-16 NOTE — Progress Notes (Signed)
Please inform patient of the following:  She is slightly anemic, but better than previous years. Her "bad" cholesterol is slightly elevated. Everything else is normal. Recommend starting iron supplement if not doing so already and continuing to work on diet and exercise and we can recheck in a year or so.  Algis Greenhouse. Jerline Pain, MD 06/16/2019 8:01 AM

## 2019-08-22 ENCOUNTER — Encounter: Payer: Self-pay | Admitting: Family Medicine

## 2019-08-23 ENCOUNTER — Other Ambulatory Visit: Payer: Self-pay

## 2019-08-23 MED ORDER — HYDROCHLOROTHIAZIDE 25 MG PO TABS: 25.0000 mg | ORAL_TABLET | Freq: Every day | ORAL | 0 refills | Status: DC

## 2019-10-15 ENCOUNTER — Encounter: Payer: Self-pay | Admitting: Family Medicine

## 2019-10-15 ENCOUNTER — Other Ambulatory Visit: Payer: Self-pay

## 2019-10-15 DIAGNOSIS — J208 Acute bronchitis due to other specified organisms: Secondary | ICD-10-CM

## 2019-10-15 DIAGNOSIS — B9689 Other specified bacterial agents as the cause of diseases classified elsewhere: Secondary | ICD-10-CM

## 2019-10-15 MED ORDER — PROAIR RESPICLICK 108 (90 BASE) MCG/ACT IN AEPB: 2.0000 | INHALATION_SPRAY | Freq: Four times a day (QID) | RESPIRATORY_TRACT | 2 refills | Status: AC | PRN

## 2019-11-11 ENCOUNTER — Encounter: Payer: Self-pay | Admitting: Family Medicine

## 2019-11-11 NOTE — Telephone Encounter (Signed)
Please advise 

## 2019-11-15 ENCOUNTER — Other Ambulatory Visit: Payer: Self-pay | Admitting: *Deleted

## 2019-11-15 DIAGNOSIS — Z23 Encounter for immunization: Secondary | ICD-10-CM

## 2019-11-15 NOTE — Telephone Encounter (Signed)
Lab work order placed Left voice message to call and schedule apt with lab

## 2019-11-16 ENCOUNTER — Other Ambulatory Visit: Payer: Self-pay

## 2019-11-16 ENCOUNTER — Other Ambulatory Visit (INDEPENDENT_AMBULATORY_CARE_PROVIDER_SITE_OTHER): Payer: Self-pay

## 2019-11-16 DIAGNOSIS — Z23 Encounter for immunization: Secondary | ICD-10-CM

## 2019-11-16 NOTE — Addendum Note (Signed)
Addended by: Francis Dowse T on: 11/16/2019 08:15 AM   Modules accepted: Orders

## 2019-11-18 LAB — QUANTIFERON-TB GOLD PLUS
Mitogen-NIL: >10 IU/mL
NIL: 0.03 IU/mL
QuantiFERON-TB Gold Plus: NEGATIVE
TB1-NIL: 0 IU/mL
TB2-NIL: 0 IU/mL

## 2019-11-18 NOTE — Progress Notes (Signed)
Please inform patient of the following:  Quantiferon is negative.  She does not have TB.   Algis Greenhouse. Jerline Pain, MD 11/18/2019 9:20 AM

## 2019-11-25 ENCOUNTER — Other Ambulatory Visit: Payer: Self-pay | Admitting: Family Medicine

## 2020-03-02 ENCOUNTER — Encounter: Payer: Self-pay | Admitting: Family Medicine

## 2020-03-02 ENCOUNTER — Other Ambulatory Visit: Payer: Self-pay

## 2020-03-02 MED ORDER — HYDROCHLOROTHIAZIDE 25 MG PO TABS: ORAL_TABLET | ORAL | 1 refills | Status: DC

## 2020-06-23 ENCOUNTER — Encounter: Payer: 59 | Admitting: Family Medicine

## 2020-06-23 DIAGNOSIS — Z0289 Encounter for other administrative examinations: Secondary | ICD-10-CM

## 2020-07-07 DIAGNOSIS — R52 Pain, unspecified: Secondary | ICD-10-CM | POA: Diagnosis not present

## 2020-07-07 DIAGNOSIS — U071 COVID-19: Secondary | ICD-10-CM | POA: Diagnosis not present

## 2020-07-07 DIAGNOSIS — B001 Herpesviral vesicular dermatitis: Secondary | ICD-10-CM | POA: Diagnosis not present

## 2020-07-07 DIAGNOSIS — R059 Cough, unspecified: Secondary | ICD-10-CM | POA: Diagnosis not present

## 2020-09-26 ENCOUNTER — Other Ambulatory Visit: Payer: Self-pay | Admitting: *Deleted

## 2020-09-26 ENCOUNTER — Encounter: Payer: Self-pay | Admitting: Family Medicine

## 2020-09-26 MED ORDER — HYDROCHLOROTHIAZIDE 25 MG PO TABS: ORAL_TABLET | ORAL | 0 refills | Status: DC

## 2020-12-13 ENCOUNTER — Ambulatory Visit: Payer: BC Managed Care – PPO | Admitting: Family Medicine

## 2020-12-13 ENCOUNTER — Other Ambulatory Visit (HOSPITAL_COMMUNITY): Payer: Self-pay

## 2020-12-13 ENCOUNTER — Encounter: Payer: Self-pay | Admitting: Family Medicine

## 2020-12-13 ENCOUNTER — Other Ambulatory Visit: Payer: Self-pay

## 2020-12-13 VITALS — BP 165/95 | HR 81 | Temp 97.7°F | Ht 66.0 in | Wt 326.4 lb

## 2020-12-13 DIAGNOSIS — R5383 Other fatigue: Secondary | ICD-10-CM | POA: Diagnosis not present

## 2020-12-13 DIAGNOSIS — Z13220 Encounter for screening for lipoid disorders: Secondary | ICD-10-CM | POA: Diagnosis not present

## 2020-12-13 DIAGNOSIS — I10 Essential (primary) hypertension: Secondary | ICD-10-CM

## 2020-12-13 LAB — TSH: TSH: 2.89 u[IU]/mL (ref 0.35–4.50)

## 2020-12-13 LAB — LIPID PANEL
Cholesterol: 174 mg/dL (ref 0–200)
HDL: 53.9 mg/dL (ref >39.00)
LDL Cholesterol: 112 mg/dL — ABNORMAL HIGH (ref 0–99)
NonHDL: 120.27
Total CHOL/HDL Ratio: 3
Triglycerides: 39 mg/dL (ref 0.0–149.0)
VLDL: 7.8 mg/dL (ref 0.0–40.0)

## 2020-12-13 LAB — IBC + FERRITIN
Ferritin: 9.2 ng/mL — ABNORMAL LOW (ref 10.0–291.0)
Iron: 85 ug/dL (ref 42–145)
Saturation Ratios: 18.1 % — ABNORMAL LOW (ref 20.0–50.0)
Transferrin: 335 mg/dL (ref 212.0–360.0)

## 2020-12-13 LAB — COMPREHENSIVE METABOLIC PANEL
ALT: 14 U/L (ref 0–35)
AST: 21 U/L (ref 0–37)
Albumin: 4.1 g/dL (ref 3.5–5.2)
Alkaline Phosphatase: 50 U/L (ref 39–117)
BUN: 11 mg/dL (ref 6–23)
CO2: 29 mEq/L (ref 19–32)
Calcium: 9.3 mg/dL (ref 8.4–10.5)
Chloride: 104 mEq/L (ref 96–112)
Creatinine, Ser: 0.74 mg/dL (ref 0.40–1.20)
GFR: 100.86 mL/min (ref >60.00)
Glucose, Bld: 91 mg/dL (ref 70–99)
Potassium: 4 mEq/L (ref 3.5–5.1)
Sodium: 139 mEq/L (ref 135–145)
Total Bilirubin: 0.3 mg/dL (ref 0.2–1.2)
Total Protein: 7.6 g/dL (ref 6.0–8.3)

## 2020-12-13 LAB — VITAMIN B12: Vitamin B-12: 405 pg/mL (ref 211–911)

## 2020-12-13 LAB — CBC
HCT: 30.9 % — ABNORMAL LOW (ref 36.0–46.0)
Hemoglobin: 10 g/dL — ABNORMAL LOW (ref 12.0–15.0)
MCHC: 32.4 g/dL (ref 30.0–36.0)
MCV: 87.5 fl (ref 78.0–100.0)
Platelets: 445 10*3/uL — ABNORMAL HIGH (ref 150.0–400.0)
RBC: 3.54 Mil/uL — ABNORMAL LOW (ref 3.87–5.11)
RDW: 14.4 % (ref 11.5–15.5)
WBC: 6.1 10*3/uL (ref 4.0–10.5)

## 2020-12-13 MED ORDER — HYDROCHLOROTHIAZIDE 25 MG PO TABS
25.0000 mg | ORAL_TABLET | Freq: Every day | ORAL | 3 refills | Status: AC
  Filled 2020-12-13: qty 90, 90d supply, fill #0
  Filled 2021-03-09: qty 90, 90d supply, fill #1
  Filled 2021-06-05: qty 90, 90d supply, fill #2
  Filled 2021-09-13: qty 90, 90d supply, fill #3

## 2020-12-13 NOTE — Assessment & Plan Note (Signed)
She has been off medication for a couple of months.  Blood pressure is above goal.  We will restart HCTZ 25 mg daily.  She has done well with this in the past.  She will continue home monitoring with goal 140/90 or lower.  Check labs today.

## 2020-12-13 NOTE — Patient Instructions (Signed)
It was very nice to see you today!  We will check blood work today.  I will restart your blood pressure medication.  Keep an eye on your blood pressure at home and let me know if it is persistently 140/90 or higher.  We may need to work on you sleep to see if this helps with your fatigue levels if your blood work is normal.  Take care, Dr Jerline Pain  PLEASE NOTE:  If you had any lab tests please let us know if you have not heard back within a few days. You may see your results on mychart before we have a chance to review them but we will give you a call once they are reviewed by [place]. If we ordered any referrals today, please let us know if you have not heard from their office within the next week.   Please try these tips to maintain a healthy lifestyle:   Eat at least 3 REAL meals and 1-2 snacks per day.  Aim for no more than 5 hours between eating.  If you eat breakfast, please do so within one hour of getting up.    Each meal should contain half fruits/vegetables, one quarter protein, and one quarter carbs (no bigger than a computer mouse)   Cut down on sweet beverages. This includes juice, soda, and sweet tea.     Drink at least 1 glass of water with each meal and aim for at least 8 glasses per day   Exercise at least 150 minutes every week.

## 2020-12-13 NOTE — Progress Notes (Signed)
   Donna Kelley is a 40 y.o. female who presents today for an office visit.  Assessment/Plan:  New/Acute Problems: Other Fatigue Check labs.  She is having some issues with insomnia as well which could be contributing.  If labs are normal would consider possibly treating insomnia versus referral for sleep study.  Chronic Problems Addressed Today: Essential hypertension She has been off medication for a couple of months.  Blood pressure is above goal.  We will restart HCTZ 25 mg daily.  She has done well with this in the past.  She will continue home monitoring with goal 140/90 or lower.  Check labs today.     Subjective:  HPI:  See A/P for status of chronic conditions.  She has noticed more fatigue and heavy periods the last couple of months.  No other recent illnesses.       Objective:  Physical Exam: BP (!) 165/95   Pulse 81   Temp 97.7 F (36.5 C) (Temporal)   Ht 5\' 6"  (1.676 m)   Wt (!) 326 lb 6.4 oz (148.1 kg)   LMP 12/05/2020   SpO2 98%   BMI 52.68 kg/m   Wt Readings from Last 3 Encounters:  12/13/20 (!) 326 lb 6.4 oz (148.1 kg)  06/14/19 (!) 317 lb 3.2 oz (143.9 kg)  01/06/18 (!) 312 lb 6.4 oz (141.7 kg)  Gen: No acute distress, resting comfortably CV: Regular rate and rhythm with no murmurs appreciated Pulm: Normal work of breathing, clear to auscultation bilaterally with no crackles, wheezes, or rhonchi Neuro: Grossly normal, moves all extremities Psych: Normal affect and thought content      Javeon Macmurray M. Jerline Pain, MD 12/13/2020 10:19 AM

## 2020-12-14 NOTE — Progress Notes (Signed)
Please inform patient of the following:  She is low on iron.  This is probably causing her fatigue.  All of her other labs are normal.  I would like for her to take iron supplementation 65 mg daily on an empty stomach every other day with vitamin C.  We should recheck again in 3 to 6 months.

## 2021-02-06 ENCOUNTER — Other Ambulatory Visit (HOSPITAL_COMMUNITY): Payer: Self-pay

## 2021-02-06 DIAGNOSIS — J019 Acute sinusitis, unspecified: Secondary | ICD-10-CM | POA: Diagnosis not present

## 2021-02-06 DIAGNOSIS — J029 Acute pharyngitis, unspecified: Secondary | ICD-10-CM | POA: Diagnosis not present

## 2021-02-06 MED ORDER — FLUTICASONE PROPIONATE 50 MCG/ACT NA SUSP
NASAL | 0 refills | Status: AC
  Filled 2021-02-06: qty 16, 30d supply, fill #0

## 2021-02-14 ENCOUNTER — Other Ambulatory Visit (HOSPITAL_COMMUNITY): Payer: Self-pay

## 2021-03-09 ENCOUNTER — Other Ambulatory Visit (HOSPITAL_COMMUNITY): Payer: Self-pay

## 2021-03-27 ENCOUNTER — Encounter: Payer: Self-pay | Admitting: Family Medicine

## 2021-03-28 ENCOUNTER — Other Ambulatory Visit: Payer: Self-pay | Admitting: *Deleted

## 2021-03-29 ENCOUNTER — Other Ambulatory Visit: Payer: Self-pay | Admitting: *Deleted

## 2021-03-29 DIAGNOSIS — N63 Unspecified lump in unspecified breast: Secondary | ICD-10-CM

## 2021-03-29 NOTE — Telephone Encounter (Signed)
Referral placed, patient aware 

## 2021-04-24 ENCOUNTER — Other Ambulatory Visit (HOSPITAL_COMMUNITY): Payer: Self-pay

## 2021-04-24 DIAGNOSIS — D508 Other iron deficiency anemias: Secondary | ICD-10-CM | POA: Diagnosis not present

## 2021-04-24 DIAGNOSIS — Z23 Encounter for immunization: Secondary | ICD-10-CM | POA: Diagnosis not present

## 2021-04-24 DIAGNOSIS — M25561 Pain in right knee: Secondary | ICD-10-CM | POA: Diagnosis not present

## 2021-04-24 DIAGNOSIS — M76892 Other specified enthesopathies of left lower limb, excluding foot: Secondary | ICD-10-CM | POA: Diagnosis not present

## 2021-04-24 DIAGNOSIS — Z124 Encounter for screening for malignant neoplasm of cervix: Secondary | ICD-10-CM | POA: Diagnosis not present

## 2021-04-24 DIAGNOSIS — G8929 Other chronic pain: Secondary | ICD-10-CM | POA: Diagnosis not present

## 2021-04-24 DIAGNOSIS — M25562 Pain in left knee: Secondary | ICD-10-CM | POA: Diagnosis not present

## 2021-04-24 DIAGNOSIS — M170 Bilateral primary osteoarthritis of knee: Secondary | ICD-10-CM | POA: Diagnosis not present

## 2021-04-24 DIAGNOSIS — I10 Essential (primary) hypertension: Secondary | ICD-10-CM | POA: Diagnosis not present

## 2021-04-24 DIAGNOSIS — Z87898 Personal history of other specified conditions: Secondary | ICD-10-CM | POA: Diagnosis not present

## 2021-04-24 MED ORDER — MELOXICAM 15 MG PO TABS
15.0000 mg | ORAL_TABLET | Freq: Every day | ORAL | 1 refills | Status: DC
  Filled 2021-04-24: qty 30, 30d supply, fill #0
  Filled 2021-06-05: qty 30, 30d supply, fill #1

## 2021-04-25 ENCOUNTER — Other Ambulatory Visit (HOSPITAL_COMMUNITY): Payer: Self-pay

## 2021-04-25 MED ORDER — OZEMPIC (0.25 OR 0.5 MG/DOSE) 2 MG/1.5ML ~~LOC~~ SOPN
PEN_INJECTOR | SUBCUTANEOUS | 0 refills | Status: DC
  Filled 2021-04-25: qty 1.5, 28d supply, fill #0

## 2021-04-30 ENCOUNTER — Other Ambulatory Visit (HOSPITAL_COMMUNITY): Payer: Self-pay

## 2021-04-30 ENCOUNTER — Other Ambulatory Visit: Payer: Self-pay | Admitting: Family Medicine

## 2021-04-30 DIAGNOSIS — N6489 Other specified disorders of breast: Secondary | ICD-10-CM

## 2021-05-02 ENCOUNTER — Ambulatory Visit
Admission: RE | Admit: 2021-05-02 | Discharge: 2021-05-02 | Disposition: A | Discharge status: Home / Self Care | Payer: BC Managed Care – PPO | Source: Ambulatory Visit | Attending: Family Medicine | Admitting: Family Medicine

## 2021-05-02 ENCOUNTER — Other Ambulatory Visit (HOSPITAL_COMMUNITY): Payer: Self-pay

## 2021-05-02 ENCOUNTER — Other Ambulatory Visit: Payer: Self-pay

## 2021-05-02 DIAGNOSIS — N6489 Other specified disorders of breast: Secondary | ICD-10-CM

## 2021-05-02 DIAGNOSIS — R922 Inconclusive mammogram: Secondary | ICD-10-CM | POA: Diagnosis not present

## 2021-05-03 ENCOUNTER — Other Ambulatory Visit (HOSPITAL_COMMUNITY): Payer: Self-pay

## 2021-05-25 ENCOUNTER — Other Ambulatory Visit (HOSPITAL_COMMUNITY): Payer: Self-pay

## 2021-05-25 MED ORDER — TRANEXAMIC ACID 650 MG PO TABS
ORAL_TABLET | ORAL | 4 refills | Status: AC
  Filled 2021-05-25: qty 90, 15d supply, fill #0
  Filled 2022-02-21 – 2022-03-07 (×2): qty 90, 15d supply, fill #1

## 2021-06-05 ENCOUNTER — Other Ambulatory Visit (HOSPITAL_COMMUNITY): Payer: Self-pay

## 2021-07-06 ENCOUNTER — Other Ambulatory Visit (HOSPITAL_COMMUNITY): Payer: Self-pay

## 2021-07-06 MED ORDER — TRIAMCINOLONE ACETONIDE 0.1 % EX OINT
TOPICAL_OINTMENT | CUTANEOUS | 1 refills | Status: AC
  Filled 2021-07-06 (×2): qty 15, 7d supply, fill #0

## 2021-07-06 MED ORDER — MELOXICAM 15 MG PO TABS
15.0000 mg | ORAL_TABLET | Freq: Every day | ORAL | 1 refills | Status: DC
  Filled 2021-07-06 (×2): qty 30, 30d supply, fill #0
  Filled 2021-08-20 – 2021-08-27 (×2): qty 30, 30d supply, fill #1

## 2021-07-06 MED ORDER — DOXYCYCLINE HYCLATE 100 MG PO CAPS
ORAL_CAPSULE | ORAL | 0 refills | Status: AC
  Filled 2021-07-06 (×2): qty 20, 10d supply, fill #0

## 2021-07-06 MED ORDER — KETOCONAZOLE 2 % EX CREA
TOPICAL_CREAM | CUTANEOUS | 1 refills | Status: AC
  Filled 2021-07-06: qty 15, 7d supply, fill #0
  Filled 2021-07-06: qty 15, 14d supply, fill #0

## 2021-07-19 ENCOUNTER — Other Ambulatory Visit (HOSPITAL_COMMUNITY): Payer: Self-pay

## 2021-07-19 MED ORDER — MOUNJARO 2.5 MG/0.5ML ~~LOC~~ SOAJ: SUBCUTANEOUS | 0 refills | Status: AC

## 2021-08-20 ENCOUNTER — Other Ambulatory Visit (HOSPITAL_COMMUNITY): Payer: Self-pay

## 2021-08-27 ENCOUNTER — Other Ambulatory Visit (HOSPITAL_COMMUNITY): Payer: Self-pay

## 2021-09-13 ENCOUNTER — Other Ambulatory Visit (HOSPITAL_COMMUNITY): Payer: Self-pay

## 2021-10-04 ENCOUNTER — Other Ambulatory Visit (HOSPITAL_COMMUNITY): Payer: Self-pay

## 2021-10-04 MED ORDER — OLMESARTAN MEDOXOMIL-HCTZ 40-25 MG PO TABS
1.0000 | ORAL_TABLET | Freq: Every day | ORAL | 1 refills | Status: DC
  Filled 2021-10-04: qty 30, 30d supply, fill #0
  Filled 2021-11-06: qty 30, 30d supply, fill #1

## 2021-11-06 ENCOUNTER — Other Ambulatory Visit (HOSPITAL_COMMUNITY): Payer: Self-pay

## 2021-11-12 ENCOUNTER — Other Ambulatory Visit (HOSPITAL_COMMUNITY): Payer: Self-pay

## 2021-11-12 MED ORDER — IPRATROPIUM BROMIDE 0.06 % NA SOLN
NASAL | 0 refills | Status: AC
  Filled 2021-11-12: qty 15, 10d supply, fill #0

## 2021-11-12 MED ORDER — HURRICAINE ONE 20 % MT SOLN: OROMUCOSAL | 0 refills | Status: AC

## 2021-11-12 MED ORDER — FEXOFENADINE HCL 180 MG PO TABS
ORAL_TABLET | ORAL | 0 refills | Status: AC
  Filled 2021-11-12 – 2022-07-24 (×2): qty 30, 30d supply, fill #0

## 2021-12-05 ENCOUNTER — Other Ambulatory Visit (HOSPITAL_COMMUNITY): Payer: Self-pay

## 2021-12-05 MED ORDER — OLMESARTAN MEDOXOMIL-HCTZ 40-25 MG PO TABS
1.0000 | ORAL_TABLET | Freq: Every day | ORAL | 5 refills | Status: DC
  Filled 2021-12-05: qty 30, 30d supply, fill #0
  Filled 2022-01-07: qty 30, 30d supply, fill #1
  Filled 2022-02-06: qty 30, 30d supply, fill #2
  Filled 2022-03-07: qty 30, 30d supply, fill #3
  Filled 2022-04-12: qty 30, 30d supply, fill #4
  Filled 2022-05-10: qty 30, 30d supply, fill #5

## 2021-12-21 ENCOUNTER — Other Ambulatory Visit (HOSPITAL_COMMUNITY): Payer: Self-pay

## 2021-12-21 MED ORDER — LORAZEPAM 0.5 MG PO TABS
ORAL_TABLET | ORAL | 0 refills | Status: AC
  Filled 2021-12-21: qty 3, 3d supply, fill #0

## 2022-01-07 ENCOUNTER — Other Ambulatory Visit (HOSPITAL_COMMUNITY): Payer: Self-pay

## 2022-02-06 ENCOUNTER — Other Ambulatory Visit (HOSPITAL_COMMUNITY): Payer: Self-pay

## 2022-02-21 ENCOUNTER — Other Ambulatory Visit (HOSPITAL_COMMUNITY): Payer: Self-pay

## 2022-02-22 ENCOUNTER — Other Ambulatory Visit (HOSPITAL_COMMUNITY): Payer: Self-pay

## 2022-02-22 MED ORDER — MELOXICAM 15 MG PO TABS
15.0000 mg | ORAL_TABLET | Freq: Every day | ORAL | 1 refills | Status: AC
  Filled 2022-02-22 – 2022-07-24 (×2): qty 30, 30d supply, fill #0
  Filled 2022-10-20: qty 30, 30d supply, fill #1

## 2022-02-25 ENCOUNTER — Other Ambulatory Visit (HOSPITAL_COMMUNITY): Payer: Self-pay

## 2022-03-05 ENCOUNTER — Other Ambulatory Visit (HOSPITAL_COMMUNITY): Payer: Self-pay

## 2022-03-07 ENCOUNTER — Other Ambulatory Visit (HOSPITAL_COMMUNITY): Payer: Self-pay

## 2022-04-01 ENCOUNTER — Encounter: Payer: Self-pay | Admitting: *Deleted

## 2022-04-12 ENCOUNTER — Other Ambulatory Visit (HOSPITAL_COMMUNITY): Payer: Self-pay

## 2022-05-10 ENCOUNTER — Other Ambulatory Visit (HOSPITAL_COMMUNITY): Payer: Self-pay

## 2022-06-13 ENCOUNTER — Other Ambulatory Visit (HOSPITAL_COMMUNITY): Payer: Self-pay

## 2022-06-13 MED ORDER — OLMESARTAN MEDOXOMIL-HCTZ 40-25 MG PO TABS
1.0000 | ORAL_TABLET | Freq: Every day | ORAL | 5 refills | Status: AC
  Filled 2022-06-13: qty 30, 30d supply, fill #0
  Filled 2023-01-23: qty 30, 30d supply, fill #1
  Filled 2023-02-21 – 2023-02-27 (×2): qty 30, 30d supply, fill #2
  Filled 2023-03-24: qty 30, 30d supply, fill #3

## 2022-06-20 ENCOUNTER — Encounter: Payer: Self-pay | Admitting: *Deleted

## 2022-07-11 ENCOUNTER — Other Ambulatory Visit (HOSPITAL_COMMUNITY): Payer: Self-pay

## 2022-07-11 MED ORDER — TRIAMCINOLONE ACETONIDE 0.1 % EX OINT
TOPICAL_OINTMENT | CUTANEOUS | 1 refills | Status: AC
  Filled 2022-07-11: qty 15, 14d supply, fill #0

## 2022-07-11 MED ORDER — OLMESARTAN MEDOXOMIL-HCTZ 40-25 MG PO TABS
1.0000 | ORAL_TABLET | Freq: Every day | ORAL | 2 refills | Status: DC
  Filled 2022-07-11: qty 90, 90d supply, fill #0
  Filled 2022-10-20: qty 90, 90d supply, fill #1
  Filled 2023-04-29: qty 30, 30d supply, fill #2
  Filled 2023-06-03 – 2023-06-04 (×2): qty 30, 30d supply, fill #3
  Filled 2023-07-08: qty 30, 30d supply, fill #4

## 2022-07-12 ENCOUNTER — Other Ambulatory Visit (HOSPITAL_COMMUNITY): Payer: Self-pay

## 2022-07-12 MED ORDER — ZEPBOUND 2.5 MG/0.5ML ~~LOC~~ SOAJ
2.5000 mg | SUBCUTANEOUS | 0 refills | Status: DC
  Filled 2022-07-12: qty 2, 28d supply, fill #0

## 2022-07-12 MED ORDER — WEGOVY 0.25 MG/0.5ML ~~LOC~~ SOAJ
0.2500 mg | SUBCUTANEOUS | 0 refills | Status: DC
  Filled 2022-07-12 – 2022-08-26 (×3): qty 2, 28d supply, fill #0

## 2022-07-24 ENCOUNTER — Other Ambulatory Visit (HOSPITAL_COMMUNITY): Payer: Self-pay

## 2022-07-26 ENCOUNTER — Other Ambulatory Visit (HOSPITAL_COMMUNITY): Payer: Self-pay

## 2022-07-27 ENCOUNTER — Other Ambulatory Visit (HOSPITAL_COMMUNITY): Payer: Self-pay

## 2022-07-31 ENCOUNTER — Other Ambulatory Visit (HOSPITAL_COMMUNITY): Payer: Self-pay

## 2022-08-02 ENCOUNTER — Other Ambulatory Visit: Payer: Self-pay

## 2022-08-13 ENCOUNTER — Other Ambulatory Visit (HOSPITAL_COMMUNITY): Payer: Self-pay

## 2022-08-14 ENCOUNTER — Other Ambulatory Visit (HOSPITAL_COMMUNITY): Payer: Self-pay

## 2022-08-21 ENCOUNTER — Other Ambulatory Visit (HOSPITAL_COMMUNITY): Payer: Self-pay

## 2022-08-24 ENCOUNTER — Other Ambulatory Visit (HOSPITAL_COMMUNITY): Payer: Self-pay

## 2022-08-26 ENCOUNTER — Other Ambulatory Visit (HOSPITAL_COMMUNITY): Payer: Self-pay

## 2022-08-26 MED ORDER — OXYMETAZOLINE HCL 0.05 % NA SOLN
2.0000 | Freq: Two times a day (BID) | NASAL | 0 refills | Status: AC
  Filled 2022-09-18 – 2022-09-23 (×2): qty 30, 75d supply, fill #0

## 2022-08-26 MED ORDER — METHYLPREDNISOLONE 4 MG PO TBPK
ORAL_TABLET | ORAL | 0 refills | Status: AC
  Filled 2022-08-26: qty 21, 6d supply, fill #0

## 2022-08-26 MED ORDER — POLYMYXIN B-TRIMETHOPRIM 10000-0.1 UNIT/ML-% OP SOLN
1.0000 [drp] | OPHTHALMIC | 0 refills | Status: AC
  Filled 2022-08-26: qty 10, 10d supply, fill #0

## 2022-08-26 MED ORDER — AMOXICILLIN-POT CLAVULANATE 875-125 MG PO TABS
1.0000 | ORAL_TABLET | Freq: Two times a day (BID) | ORAL | 0 refills | Status: AC
  Filled 2022-08-26: qty 14, 7d supply, fill #0

## 2022-08-27 ENCOUNTER — Other Ambulatory Visit (HOSPITAL_COMMUNITY): Payer: Self-pay

## 2022-09-16 ENCOUNTER — Other Ambulatory Visit (HOSPITAL_COMMUNITY): Payer: Self-pay

## 2022-09-18 ENCOUNTER — Other Ambulatory Visit (HOSPITAL_COMMUNITY): Payer: Self-pay

## 2022-09-18 MED ORDER — WEGOVY 0.5 MG/0.5ML ~~LOC~~ SOAJ
0.5000 mg | SUBCUTANEOUS | 0 refills | Status: DC
  Filled 2022-09-18: qty 2, 28d supply, fill #0

## 2022-09-19 ENCOUNTER — Other Ambulatory Visit (HOSPITAL_COMMUNITY): Payer: Self-pay

## 2022-09-23 ENCOUNTER — Other Ambulatory Visit (HOSPITAL_COMMUNITY): Payer: Self-pay

## 2022-09-24 ENCOUNTER — Other Ambulatory Visit (HOSPITAL_COMMUNITY): Payer: Self-pay

## 2022-10-20 ENCOUNTER — Other Ambulatory Visit (HOSPITAL_COMMUNITY): Payer: Self-pay

## 2022-10-21 ENCOUNTER — Other Ambulatory Visit: Payer: Self-pay

## 2022-10-22 ENCOUNTER — Other Ambulatory Visit (HOSPITAL_COMMUNITY): Payer: Self-pay

## 2022-10-22 MED ORDER — WEGOVY 0.5 MG/0.5ML ~~LOC~~ SOAJ
0.5000 mg | SUBCUTANEOUS | 0 refills | Status: DC
  Filled 2022-10-22: qty 2, 28d supply, fill #0

## 2022-10-25 ENCOUNTER — Other Ambulatory Visit (HOSPITAL_COMMUNITY): Payer: Self-pay

## 2022-10-28 ENCOUNTER — Other Ambulatory Visit (HOSPITAL_COMMUNITY): Payer: Self-pay

## 2022-10-28 MED ORDER — WEGOVY 1 MG/0.5ML ~~LOC~~ SOAJ
1.0000 mg | SUBCUTANEOUS | 0 refills | Status: DC
  Filled 2022-10-28: qty 2, 28d supply, fill #0

## 2022-10-29 ENCOUNTER — Other Ambulatory Visit (HOSPITAL_COMMUNITY): Payer: Self-pay

## 2022-10-30 ENCOUNTER — Other Ambulatory Visit (HOSPITAL_COMMUNITY): Payer: Self-pay

## 2022-11-21 ENCOUNTER — Other Ambulatory Visit (HOSPITAL_COMMUNITY): Payer: Self-pay

## 2022-11-21 MED ORDER — WEGOVY 1 MG/0.5ML ~~LOC~~ SOAJ
SUBCUTANEOUS | 0 refills | Status: DC
  Filled 2022-11-21: qty 2, 28d supply, fill #0

## 2022-11-26 ENCOUNTER — Other Ambulatory Visit (HOSPITAL_COMMUNITY): Payer: Self-pay

## 2022-11-27 ENCOUNTER — Other Ambulatory Visit (HOSPITAL_COMMUNITY): Payer: Self-pay

## 2022-11-28 ENCOUNTER — Other Ambulatory Visit (HOSPITAL_COMMUNITY): Payer: Self-pay

## 2022-12-04 ENCOUNTER — Other Ambulatory Visit (HOSPITAL_COMMUNITY): Payer: Self-pay

## 2022-12-13 ENCOUNTER — Other Ambulatory Visit (HOSPITAL_COMMUNITY): Payer: Self-pay

## 2022-12-14 ENCOUNTER — Other Ambulatory Visit (HOSPITAL_COMMUNITY): Payer: Self-pay

## 2022-12-16 ENCOUNTER — Other Ambulatory Visit (HOSPITAL_COMMUNITY): Payer: Self-pay

## 2022-12-17 ENCOUNTER — Other Ambulatory Visit (HOSPITAL_COMMUNITY): Payer: Self-pay

## 2022-12-18 ENCOUNTER — Other Ambulatory Visit (HOSPITAL_COMMUNITY): Payer: Self-pay

## 2022-12-21 ENCOUNTER — Other Ambulatory Visit (HOSPITAL_COMMUNITY): Payer: Self-pay

## 2022-12-21 MED ORDER — ZEPBOUND 2.5 MG/0.5ML ~~LOC~~ SOAJ
SUBCUTANEOUS | 0 refills | Status: DC
  Filled 2022-12-21: qty 2, 28d supply, fill #0

## 2022-12-23 ENCOUNTER — Other Ambulatory Visit (HOSPITAL_COMMUNITY): Payer: Self-pay

## 2022-12-26 ENCOUNTER — Other Ambulatory Visit (HOSPITAL_COMMUNITY): Payer: Self-pay

## 2023-01-23 ENCOUNTER — Other Ambulatory Visit: Payer: Self-pay

## 2023-01-23 ENCOUNTER — Other Ambulatory Visit (HOSPITAL_COMMUNITY): Payer: Self-pay

## 2023-01-24 ENCOUNTER — Other Ambulatory Visit (HOSPITAL_COMMUNITY): Payer: Self-pay

## 2023-01-24 MED ORDER — ZEPBOUND 5 MG/0.5ML ~~LOC~~ SOAJ
SUBCUTANEOUS | 0 refills | Status: DC
  Filled 2023-01-24: qty 2, 28d supply, fill #0

## 2023-01-25 ENCOUNTER — Other Ambulatory Visit (HOSPITAL_COMMUNITY): Payer: Self-pay

## 2023-02-21 ENCOUNTER — Other Ambulatory Visit (HOSPITAL_COMMUNITY): Payer: Self-pay

## 2023-02-24 ENCOUNTER — Other Ambulatory Visit (HOSPITAL_COMMUNITY): Payer: Self-pay

## 2023-02-24 ENCOUNTER — Other Ambulatory Visit: Payer: Self-pay

## 2023-02-27 ENCOUNTER — Other Ambulatory Visit (HOSPITAL_COMMUNITY): Payer: Self-pay

## 2023-03-27 ENCOUNTER — Other Ambulatory Visit (HOSPITAL_COMMUNITY): Payer: Self-pay

## 2023-03-28 ENCOUNTER — Other Ambulatory Visit (HOSPITAL_COMMUNITY): Payer: Self-pay

## 2023-03-31 ENCOUNTER — Other Ambulatory Visit (HOSPITAL_COMMUNITY): Payer: Self-pay

## 2023-04-05 IMAGING — MG DIGITAL DIAGNOSTIC BILAT W/ TOMO W/ CAD
8 of 10 series · 8 of 22 positions shown · non-contrast
Comparison: Previous exam(s).

CLINICAL DATA: 40-year-old female presenting for annual bilateral
mammogram and delayed follow-up of a probably benign right breast
asymmetry.

EXAM:
DIGITAL DIAGNOSTIC BILATERAL MAMMOGRAM WITH TOMOSYNTHESIS AND CAD
TECHNIQUE: Bilateral digital diagnostic mammography and breast tomosynthesis
was performed. The images were evaluated with computer-aided
detection.

[L CC synth-2D (1 of 2)]
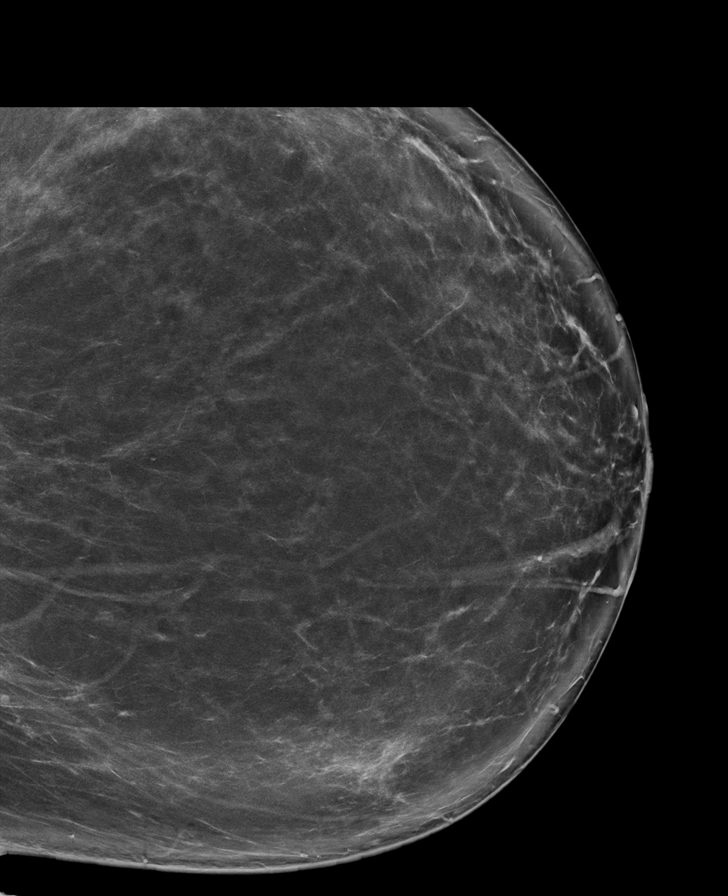

[L MLO synth-2D (1 of 2)]
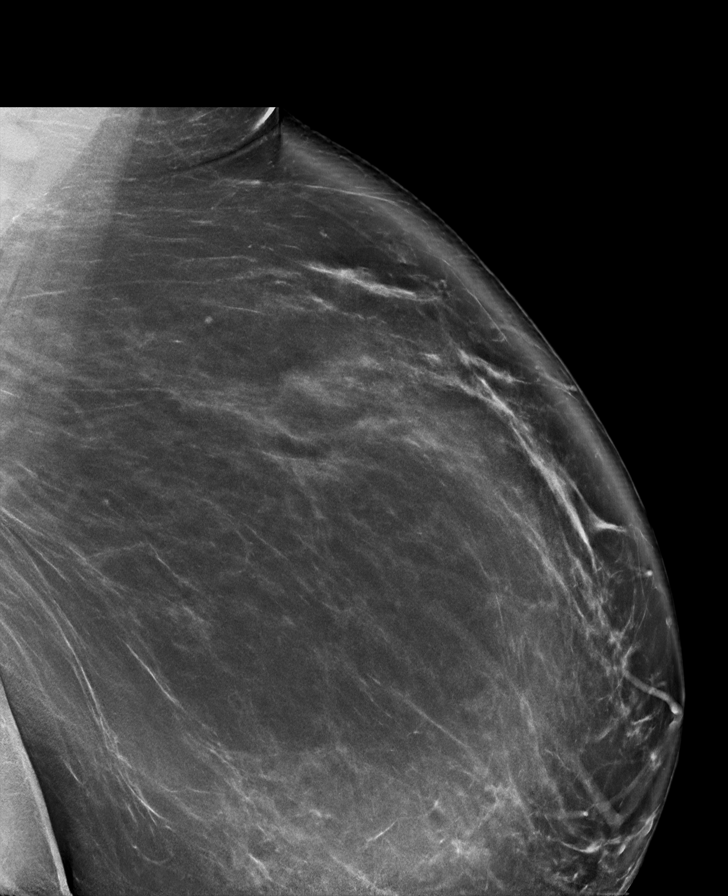

[L MLO synth-2D (2 of 2)]
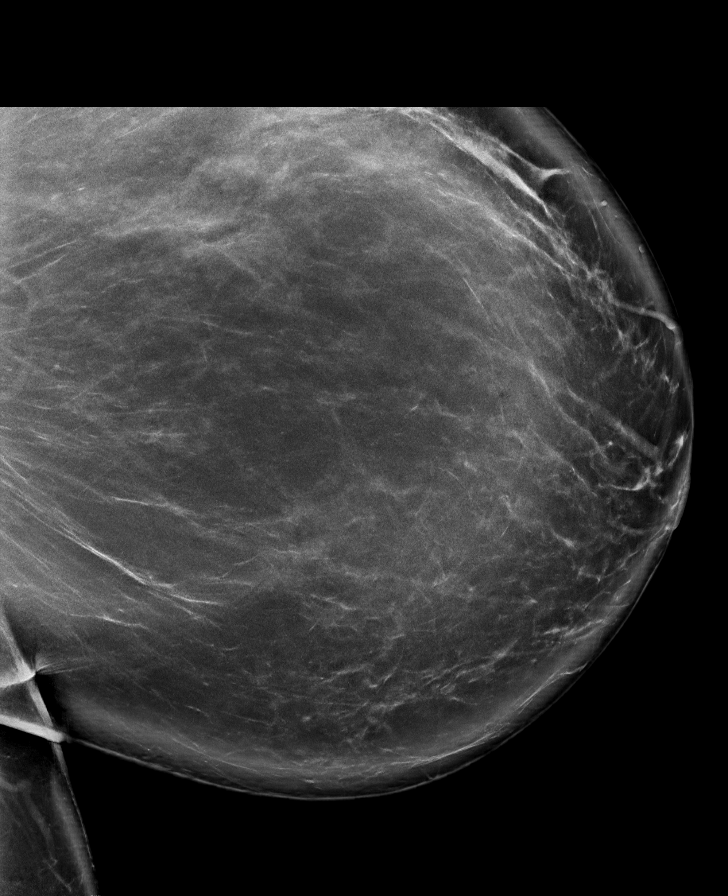

[R MLO synth-2D (1 of 2)]
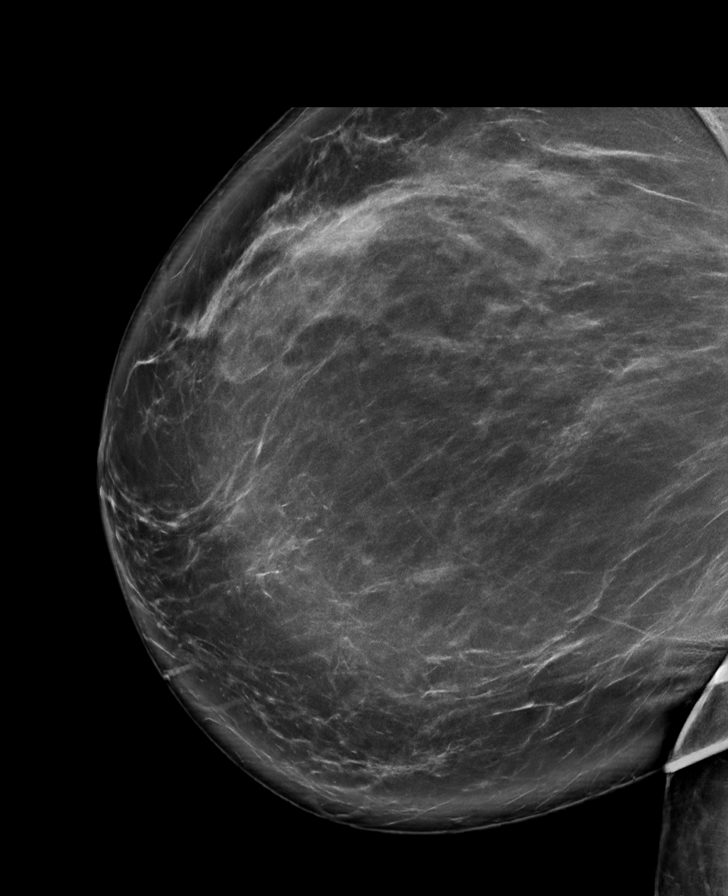

[R CC synth-2D]
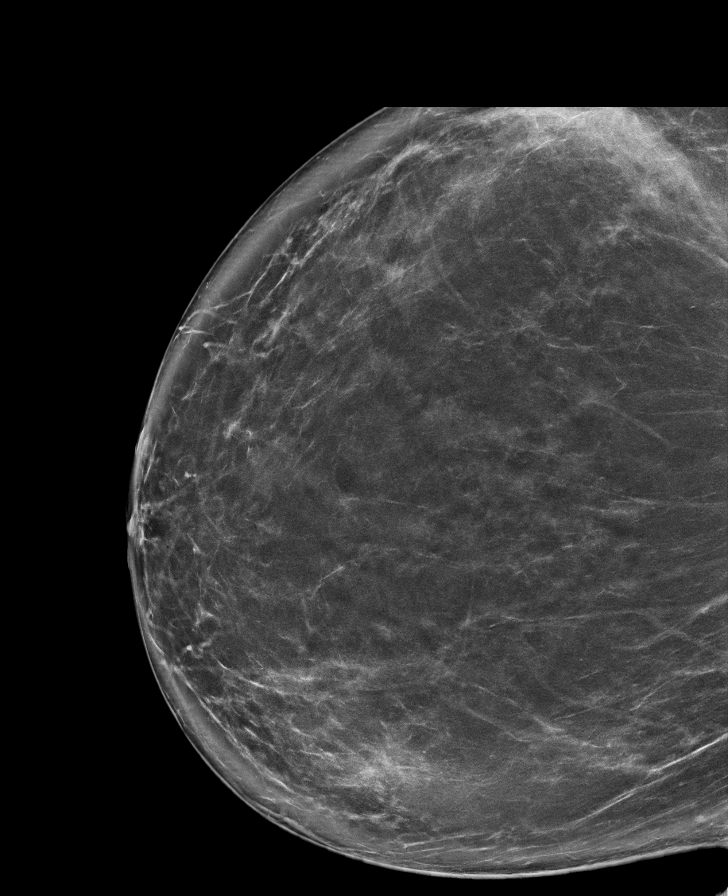

[R MLO synth-2D (2 of 2)]
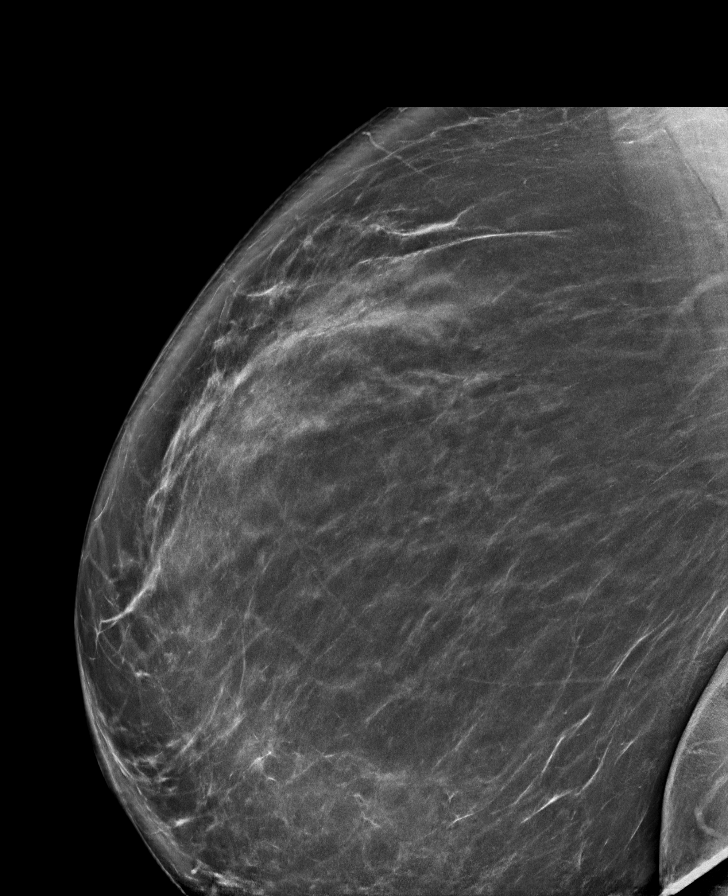

[L CC synth-2D (2 of 2)]
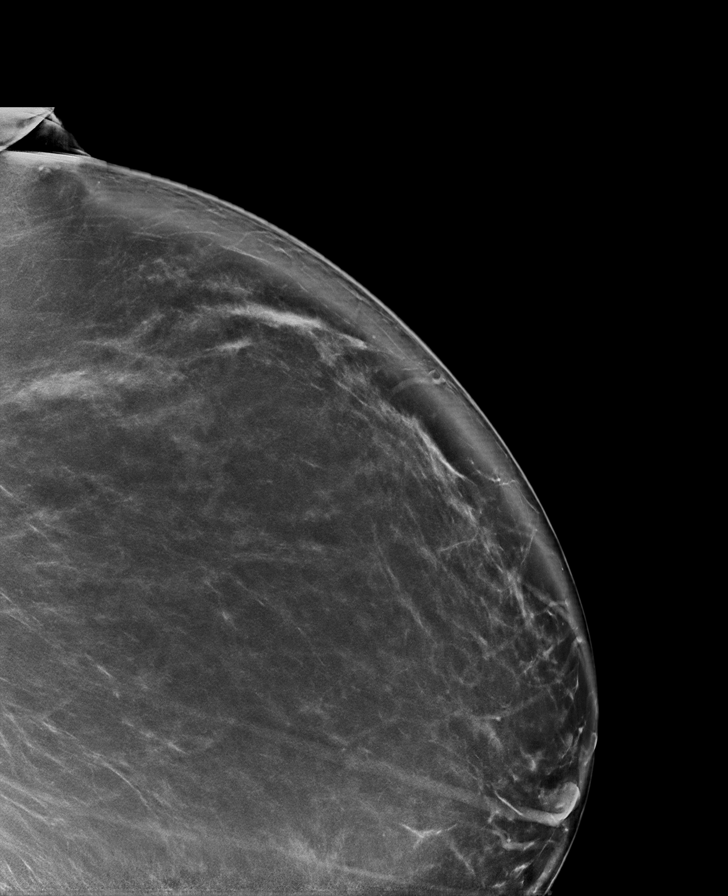

[R MLO tomo · tomo slice 59/117.0]
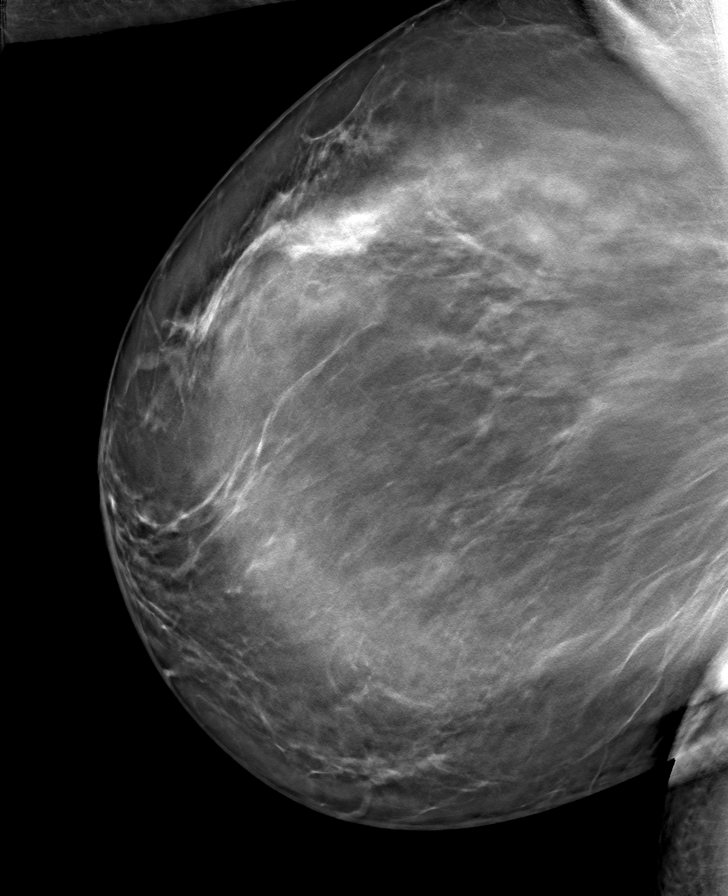

[8 of 22 positions shown; findings below may reference images not displayed]

ACR Breast Density Category b: There are scattered areas of
fibroglandular density.
FINDINGS: Focal asymmetry in the upper outer right breast is less conspicuous
on today's views. No new or suspicious findings are identified. The
parenchymal pattern is otherwise stable.
IMPRESSION: 1. No mammographic evidence of malignancy in either breast.
2. Benign right breast focal asymmetry. No further imaging follow-up
required.

RECOMMENDATION:
Screening mammogram in one year.(Code:[id number])

I have discussed the findings and recommendations with the patient.
If applicable, a reminder letter will be sent to the patient
regarding the next appointment.

BI-RADS CATEGORY  2: Benign.

## 2023-04-14 ENCOUNTER — Other Ambulatory Visit: Payer: Self-pay

## 2023-04-14 ENCOUNTER — Other Ambulatory Visit (HOSPITAL_COMMUNITY): Payer: Self-pay

## 2023-04-14 MED ORDER — ZEPBOUND 5 MG/0.5ML ~~LOC~~ SOAJ
5.0000 mg | SUBCUTANEOUS | 0 refills | Status: AC
  Filled 2023-04-14: qty 2, 28d supply, fill #0

## 2023-04-15 ENCOUNTER — Other Ambulatory Visit (HOSPITAL_COMMUNITY): Payer: Self-pay

## 2023-04-28 ENCOUNTER — Other Ambulatory Visit (HOSPITAL_COMMUNITY): Payer: Self-pay

## 2023-04-29 ENCOUNTER — Other Ambulatory Visit (HOSPITAL_COMMUNITY): Payer: Self-pay

## 2023-05-27 ENCOUNTER — Other Ambulatory Visit (HOSPITAL_COMMUNITY): Payer: Self-pay

## 2023-06-04 ENCOUNTER — Other Ambulatory Visit (HOSPITAL_COMMUNITY): Payer: Self-pay

## 2023-07-08 ENCOUNTER — Other Ambulatory Visit (HOSPITAL_COMMUNITY): Payer: Self-pay

## 2023-07-10 ENCOUNTER — Other Ambulatory Visit (HOSPITAL_COMMUNITY): Payer: Self-pay

## 2023-07-30 ENCOUNTER — Other Ambulatory Visit (HOSPITAL_BASED_OUTPATIENT_CLINIC_OR_DEPARTMENT_OTHER): Payer: Self-pay

## 2023-07-30 ENCOUNTER — Other Ambulatory Visit: Payer: Self-pay

## 2023-07-30 ENCOUNTER — Encounter (HOSPITAL_BASED_OUTPATIENT_CLINIC_OR_DEPARTMENT_OTHER): Payer: Self-pay

## 2023-07-30 ENCOUNTER — Emergency Department (HOSPITAL_BASED_OUTPATIENT_CLINIC_OR_DEPARTMENT_OTHER)
Admission: EM | Admit: 2023-07-30 | Discharge: 2023-07-30 | Disposition: A | Discharge status: Home / Self Care | Payer: Self-pay | Attending: Emergency Medicine | Admitting: Emergency Medicine

## 2023-07-30 DIAGNOSIS — J101 Influenza due to other identified influenza virus with other respiratory manifestations: Secondary | ICD-10-CM | POA: Insufficient documentation

## 2023-07-30 DIAGNOSIS — Z1152 Encounter for screening for COVID-19: Secondary | ICD-10-CM | POA: Insufficient documentation

## 2023-07-30 DIAGNOSIS — I10 Essential (primary) hypertension: Secondary | ICD-10-CM | POA: Insufficient documentation

## 2023-07-30 DIAGNOSIS — Z79899 Other long term (current) drug therapy: Secondary | ICD-10-CM | POA: Insufficient documentation

## 2023-07-30 LAB — RESP PANEL BY RT-PCR (RSV, FLU A&B, COVID)  RVPGX2
Influenza A by PCR: POSITIVE — AB
Influenza B by PCR: NEGATIVE
Resp Syncytial Virus by PCR: NEGATIVE
SARS Coronavirus 2 by RT PCR: NEGATIVE

## 2023-07-30 MED ORDER — BENZONATATE 100 MG PO CAPS
100.0000 mg | ORAL_CAPSULE | Freq: Three times a day (TID) | ORAL | 0 refills | Status: AC
  Filled 2023-07-30: qty 21, 7d supply, fill #0

## 2023-07-30 MED ORDER — ACETAMINOPHEN 500 MG PO TABS
500.0000 mg | ORAL_TABLET | Freq: Four times a day (QID) | ORAL | 0 refills | Status: AC | PRN
  Filled 2023-07-30: qty 30, 8d supply, fill #0

## 2023-07-30 NOTE — ED Triage Notes (Signed)
In for eval of productive cough with green sputum, chills, sore throat, body aches onset Monday evening. Took Excedrin last pm,

## 2023-07-30 NOTE — Discharge Instructions (Signed)
You have been evaluated for your symptoms.  You have tested positive for influenza A, please take Tylenol and Tessalon as needed for symptom control.  Your infection may last 1 to 2 weeks as this is the expected recovery course.

## 2023-07-30 NOTE — ED Provider Notes (Signed)
[place] EMERGENCY DEPARTMENT AT [facility] Provider Note   CSN: [id number] Arrival date & time: 07/30/23  1258     History  Chief Complaint  Patient presents with   Cough    Donna Kelley is a 43 y.o. female.  The history is provided by the patient and medical records. No language interpreter was used.  Cough    43 year old female with history of hypertension, emphysema, anemia, migraine, presenting with flu like symptoms.  She mention for the past 2 to 3 days she has had fever, chills, throat irritation, congestion, cough productive with green sputum, body aches.  Symptom not have been controlled with Excedrin.  No recent sick contact.  No shortness of breath no nausea vomiting or diarrhea no dysuria.  Denies tobacco use.  Has history of bronchitis in the past.  Home Medications Prior to Admission medications   Medication Sig Start Date End Date Taking? Authorizing Provider  Albuterol Sulfate (PROAIR RESPICLICK) 108 (90 Base) MCG/ACT AEPB Inhale 2 puffs into the lungs every 6 (six) hours as needed (shortness of breath). 10/15/19   Ardith Dark, MD  amoxicillin-clavulanate (AUGMENTIN) 875-125 MG tablet Take 1 tablet by mouth 2 times daily for 7 days 08/26/22     benzocaine (HURRICAINE ONE) 20 % SOLN Apply 1 spray into the inside of the throat 4 times a day as needed for pain 11/12/21     doxycycline (VIBRAMYCIN) 100 MG capsule Take 1 capsule (100 mg total) by mouth 2 times daily for 10 days. 07/06/21     fexofenadine (GOODSENSE ALLER-EASE) 180 MG tablet Take 1 tablet by mouth once a day 11/12/21     fluticasone [facility]) 50 MCG/ACT nasal spray Use 2 spray in each nostril once a day 02/06/21     hydrochlorothiazide (HYDRODIURIL) 25 MG tablet TAKE 1 TABLET BY MOUTH ONCE A DAY (NEEDS OFFICE VISIT) 12/13/20 12/13/21  Ardith Dark, MD  ipratropium (ATROVENT) 0.06 % nasal spray Take 2 sprays into each nostril 4 times a day 11/12/21     ketoconazole (NIZORAL) 2  % cream Apply to affected area twice daily for 2 weeks. 07/06/21     LORazepam (ATIVAN) 0.5 MG tablet Take 1 tablet by mouth 30 minutes before flight. 12/21/21     meloxicam (MOBIC) 15 MG tablet Take 1 tablet (15 mg total) by mouth daily. 02/22/22     methylPREDNISolone (MEDROL) 4 MG TBPK tablet Follow dosepak instructions. 08/26/22     Multiple Vitamins-Minerals (MULTIVITAMIN WITH MINERALS) tablet Take 1 tablet by mouth daily.    [provider]  olmesartan-hydrochlorothiazide (BENICAR HCT) 40-25 MG tablet Take 1 tablet by mouth daily. 06/13/22     olmesartan-hydrochlorothiazide (BENICAR HCT) 40-25 MG tablet Take 1 tablet by mouth daily. 07/11/22     oxymetazoline (AFRIN 12 HOUR) 0.05 % nasal spray Place 2 sprays into both nostrils 2 (two) times daily for nasal congestion (max of 3 days) 08/26/22     SUMAtriptan (IMITREX) 100 MG tablet Take 1 tablet (100 mg total) every 2 (two) hours as needed by mouth for migraine. May repeat in 2 hours if headache persists or recurs. 05/26/17   Ardith Dark, MD  tirzepatide [facility]) 2.5 MG/0.5ML Pen Inject 2.5 mg into the skin once a week. 07/19/21     tirzepatide (ZEPBOUND) 5 MG/0.5ML Pen Inject 5 mg into the skin every 7 (seven) days. 04/13/23     tranexamic acid (LYSTEDA) 650 MG TABS tablet Take 2 tablets by mouth 3 times  daily. Take for up to 5 days per cycle as needed to control heavy menses. 05/25/21     triamcinolone ointment (KENALOG) 0.1 % Apply topically to affected areas 2 times daily. 07/06/21     triamcinolone ointment (KENALOG) 0.1 % Apply to affected area 2 times daily. 07/11/22     trimethoprim-polymyxin b (POLYTRIM) ophthalmic solution Place 1 drop into affected eyes every 3 hours for 7 - 10 days 08/26/22         Allergies    Shellfish allergy and Erythromycin    Review of Systems   Review of Systems  Respiratory:  Positive for cough.   All other systems reviewed and are negative.   Physical Exam Updated Vital Signs Ht 5\' 5"  (1.651  m)   Wt (!) 142.9 kg   BMI 52.42 kg/m  Physical Exam Vitals and nursing note reviewed.  Constitutional:      General: She is not in acute distress.    Appearance: She is well-developed. She is obese.  HENT:     Head: Atraumatic.     Nose: Nose normal.     Mouth/Throat:     Mouth: Mucous membranes are moist.  Eyes:     Conjunctiva/sclera: Conjunctivae normal.  Cardiovascular:     Rate and Rhythm: Normal rate and regular rhythm.     Pulses: Normal pulses.     Heart sounds: Normal heart sounds.  Pulmonary:     Effort: Pulmonary effort is normal.     Breath sounds: No wheezing, rhonchi or rales.  Abdominal:     Palpations: Abdomen is soft.     Tenderness: There is no abdominal tenderness.  Musculoskeletal:     Cervical back: Normal range of motion and neck supple. No rigidity.  Skin:    Findings: No rash.  Neurological:     Mental Status: She is alert and oriented to person, place, and time.  Psychiatric:        Mood and Affect: Mood normal.     ED Results / Procedures / Treatments   Labs (all labs ordered are listed, but only abnormal results are displayed) Labs Reviewed  RESP PANEL BY RT-PCR (RSV, FLU A&B, COVID)  [id number] - Abnormal; Notable for the following components:      Result Value   Influenza A by PCR POSITIVE (*)    All other components within normal limits    EKG None  Radiology No results found.  Procedures Procedures    Medications Ordered in ED Medications - No data to display  ED Course/ Medical Decision Making/ A&P                                 Medical Decision Making  BP (!) 141/89   Pulse 98   Temp 98.3 F (36.8 C) (Oral)   Resp 17   Ht 5\' 5"  (1.651 m)   Wt (!) 142.9 kg   LMP 07/21/2023 (Approximate)   SpO2 96%   BMI 52.42 kg/m   52:69 PM   43 year old female with history of hypertension, emphysema, anemia, migraine, presenting with flu like symptoms.  She mention for the past 2 to 3 days she has had fever, chills, throat  irritation, congestion, cough productive with green sputum, body aches.  Symptom not have been controlled with Excedrin.  No recent sick contact.  No shortness of breath no nausea vomiting or diarrhea no dysuria.  Denies tobacco use.  Has  history of bronchitis in the past.  Exam overall reassuring this is a well-appearing female sitting in bed coughing a few times but nontoxic in appearance.  Heart with normal rate and rhythm, lungs are clear to auscultation bilaterally  Vital signs are reassuring, no fever no hypoxia.  Labs obtained independent viewed by me and is positive for influenza A.  Findings consistent with patient presentation.  After further discussion, patient opted for supportive care As she is outside the window for Tamiflu.  Work note provided.    DDx: covid, flu, RSV, viral illness, pneumonia  Hospital admission considered but without hypoxia pt is stable to go home.  Return precaution given.         Final Clinical Impression(s) / ED Diagnoses Final diagnoses:  Influenza A    Rx / DC Orders ED Discharge Orders          Ordered    acetaminophen (TYLENOL) 500 MG tablet  Every 6 hours PRN        07/30/23 1454    benzonatate (TESSALON) 100 MG capsule  Every 8 hours        07/30/23 1454              Fayrene Helper, PA-C 07/30/23 1455    [place], Adam, DO 08/02/23 [phone]

## 2023-08-13 ENCOUNTER — Other Ambulatory Visit (HOSPITAL_COMMUNITY): Payer: Self-pay

## 2023-08-13 MED ORDER — OLMESARTAN MEDOXOMIL-HCTZ 40-25 MG PO TABS
1.0000 | ORAL_TABLET | Freq: Every day | ORAL | 2 refills | Status: AC
  Filled 2023-08-13: qty 90, 90d supply, fill #0

## 2023-08-14 ENCOUNTER — Other Ambulatory Visit (HOSPITAL_COMMUNITY): Payer: Self-pay

## 2023-08-25 ENCOUNTER — Other Ambulatory Visit (HOSPITAL_COMMUNITY): Payer: Self-pay
